# Patient Record
Sex: Female | Born: 1975 | Race: White | Hispanic: No | Marital: Single | Smoking: Current every day smoker
Health system: Southern US, Community
[De-identification: ages and names within clinical notes are randomized; demographics above are authoritative.]

## PROBLEM LIST (undated history)

## (undated) DIAGNOSIS — G4730 Sleep apnea, unspecified: Secondary | ICD-10-CM

## (undated) DIAGNOSIS — E039 Hypothyroidism, unspecified: Secondary | ICD-10-CM

## (undated) DIAGNOSIS — F329 Major depressive disorder, single episode, unspecified: Secondary | ICD-10-CM

## (undated) DIAGNOSIS — F419 Anxiety disorder, unspecified: Secondary | ICD-10-CM

## (undated) DIAGNOSIS — K219 Gastro-esophageal reflux disease without esophagitis: Secondary | ICD-10-CM

## (undated) DIAGNOSIS — K2970 Gastritis, unspecified, without bleeding: Secondary | ICD-10-CM

## (undated) DIAGNOSIS — D649 Anemia, unspecified: Secondary | ICD-10-CM

## (undated) DIAGNOSIS — T8859XA Other complications of anesthesia, initial encounter: Secondary | ICD-10-CM

## (undated) DIAGNOSIS — F32A Depression, unspecified: Secondary | ICD-10-CM

## (undated) DIAGNOSIS — R000 Tachycardia, unspecified: Secondary | ICD-10-CM

## (undated) DIAGNOSIS — M1990 Unspecified osteoarthritis, unspecified site: Secondary | ICD-10-CM

## (undated) DIAGNOSIS — R569 Unspecified convulsions: Secondary | ICD-10-CM

## (undated) DIAGNOSIS — T4145XA Adverse effect of unspecified anesthetic, initial encounter: Secondary | ICD-10-CM

## (undated) DIAGNOSIS — E785 Hyperlipidemia, unspecified: Secondary | ICD-10-CM

## (undated) DIAGNOSIS — G4733 Obstructive sleep apnea (adult) (pediatric): Secondary | ICD-10-CM

## (undated) HISTORY — DX: Unspecified osteoarthritis, unspecified site: M19.90

## (undated) HISTORY — DX: Gastritis, unspecified, without bleeding: K29.70

## (undated) HISTORY — PX: CHOLECYSTECTOMY: SHX55

## (undated) HISTORY — DX: Depression, unspecified: F32.A

## (undated) HISTORY — DX: Hyperlipidemia, unspecified: E78.5

## (undated) HISTORY — PX: ABDOMINAL HYSTERECTOMY: SHX81

## (undated) HISTORY — PX: COLONOSCOPY WITH ESOPHAGOGASTRODUODENOSCOPY (EGD): SHX5779

## (undated) HISTORY — DX: Obstructive sleep apnea (adult) (pediatric): G47.33

## (undated) HISTORY — PX: THYROIDECTOMY: SHX17

---
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
## 2017-02-19 DIAGNOSIS — H538 Other visual disturbances: Secondary | ICD-10-CM | POA: Insufficient documentation

## 2017-02-19 DIAGNOSIS — R202 Paresthesia of skin: Secondary | ICD-10-CM | POA: Insufficient documentation

## 2017-02-19 DIAGNOSIS — R519 Headache, unspecified: Secondary | ICD-10-CM | POA: Insufficient documentation

## 2017-02-19 DIAGNOSIS — G479 Sleep disorder, unspecified: Secondary | ICD-10-CM | POA: Insufficient documentation

## 2017-03-01 DIAGNOSIS — D500 Iron deficiency anemia secondary to blood loss (chronic): Secondary | ICD-10-CM | POA: Insufficient documentation

## 2017-03-31 ENCOUNTER — Ambulatory Visit: Payer: BLUE CROSS/BLUE SHIELD | Attending: Neurology

## 2017-03-31 DIAGNOSIS — G4733 Obstructive sleep apnea (adult) (pediatric): Secondary | ICD-10-CM | POA: Diagnosis present

## 2017-03-31 DIAGNOSIS — Z6841 Body Mass Index (BMI) 40.0 and over, adult: Secondary | ICD-10-CM | POA: Insufficient documentation

## 2017-03-31 DIAGNOSIS — G4710 Hypersomnia, unspecified: Secondary | ICD-10-CM | POA: Insufficient documentation

## 2017-04-08 DIAGNOSIS — R202 Paresthesia of skin: Secondary | ICD-10-CM | POA: Insufficient documentation

## 2017-04-29 ENCOUNTER — Ambulatory Visit: Payer: BLUE CROSS/BLUE SHIELD | Attending: Neurology

## 2017-04-29 DIAGNOSIS — G4733 Obstructive sleep apnea (adult) (pediatric): Secondary | ICD-10-CM | POA: Insufficient documentation

## 2017-05-13 ENCOUNTER — Encounter: Payer: Self-pay | Admitting: *Deleted

## 2017-05-13 ENCOUNTER — Other Ambulatory Visit: Payer: Self-pay

## 2017-05-13 ENCOUNTER — Encounter
Admission: RE | Admit: 2017-05-13 | Discharge: 2017-05-13 | Disposition: A | Discharge status: Home / Self Care | Payer: BLUE CROSS/BLUE SHIELD | Source: Ambulatory Visit | Attending: Obstetrics & Gynecology | Admitting: Obstetrics & Gynecology

## 2017-05-13 HISTORY — DX: Sleep apnea, unspecified: G47.30

## 2017-05-13 HISTORY — DX: Other complications of anesthesia, initial encounter: T88.59XA

## 2017-05-13 HISTORY — DX: Gastro-esophageal reflux disease without esophagitis: K21.9

## 2017-05-13 HISTORY — DX: Hypothyroidism, unspecified: E03.9

## 2017-05-13 HISTORY — DX: Tachycardia, unspecified: R00.0

## 2017-05-13 HISTORY — DX: Anxiety disorder, unspecified: F41.9

## 2017-05-13 HISTORY — DX: Adverse effect of unspecified anesthetic, initial encounter: T41.45XA

## 2017-05-13 HISTORY — DX: Unspecified convulsions: R56.9

## 2017-05-13 HISTORY — DX: Anemia, unspecified: D64.9

## 2017-05-13 NOTE — Patient Instructions (Addendum)
Your procedure is scheduled on: 05-21-17 FRIDAY Report to Same Day Surgery 2nd floor medical mall [facility](Medical Mall Entrance-take elevator on left to 2nd floor.  Check in with surgery information desk.) To find out your arrival time please call [phone] between 1PM - 3PM on 05-20-17 THURSDAY  Remember: Instructions that are not followed completely may result in serious medical risk, up to and including death, or upon the discretion of your surgeon and anesthesiologist your surgery may need to be rescheduled.    _x___ 1. Do not eat food after midnight the night before your procedure. NO GUM OR CANDY AFTER MIDNIGHT.  You may drink clear liquids up to 2 hours before you are scheduled to arrive at the hospital for your procedure.  Do not drink clear liquids within 2 hours of your scheduled arrival to the hospital.  Clear liquids include  --Water or Apple juice without pulp  --Clear carbohydrate beverage such as ClearFast or Gatorade  --Black Coffee or Clear Tea (No milk, no creamers, do not add anything to  the coffee or Tea     __x__ 2. No Alcohol for 24 hours before or after surgery.   __x__3. No Smoking for 24 prior to surgery.   ____  4. Bring all medications with you on the day of surgery if instructed.    __x__ 5. Notify your doctor if there is any change in your medical condition     (cold, fever, infections).     Do not wear jewelry, make-up, hairpins, clips or nail polish.  Do not wear lotions, powders, or perfumes. You may wear deodorant.  Do not shave 48 hours prior to surgery. Men may shave face and neck.  Do not bring valuables to the hospital.    [facility] is not responsible for any belongings or valuables.               Contacts, dentures or bridgework may not be worn into surgery.  Leave your suitcase in the car. After surgery it may be brought to your room.  For patients admitted to the hospital, discharge time is determined by your treatment team.   Patients  discharged the day of surgery will not be allowed to drive home.  You will need someone to drive you home and stay with you the night of your procedure.    Please read over the following fact sheets that you were given:   [facility] Preparing for Surgery and or MRSA Information   _x___ TAKE THE FOLLOWING MEDICATION THE MORNING OF SURGERY WITH A SMALL SIP OF WATER.  These include:  1.  KLONOPIN  2. LEVOHTYROXINE  3. OXYCODONE  4. GABAPENTIN-TAKE 600 MG INSTEAD OF 300 MG AM OF SURGERY PER DR WARD  5.  6.  ____Fleets enema or Magnesium Citrate as directed.   _x___ Use CHG Soap or sage wipes as directed on instruction sheet   ____ Use inhalers on the day of surgery and bring to hospital day of surgery  ____ Stop Metformin and Janumet 2 days prior to surgery.    ____ Take 1/2 of usual insulin dose the night before surgery and none on the morning surgery.   ____ Follow recommendations from Cardiologist, Pulmonologist or PCP regarding stopping Aspirin, Coumadin, Plavix ,Eliquis, Effient, or Pradaxa, and Pletal.  X____Stop Anti-inflammatories such as Advil, Aleve, IBUPROFEN, Motrin, Naproxen, Naprosyn, Goodies powders or aspirin products NOW-OK to take Tylenol    ____ Stop supplements until after surgery.    ____ [employer] Hotels[employer]  C-Pap to the hospital.

## 2017-05-17 ENCOUNTER — Encounter
Admission: RE | Admit: 2017-05-17 | Discharge: 2017-05-17 | Disposition: A | Discharge status: Home / Self Care | Payer: BLUE CROSS/BLUE SHIELD | Source: Ambulatory Visit | Attending: Obstetrics & Gynecology | Admitting: Obstetrics & Gynecology

## 2017-05-17 DIAGNOSIS — Z01810 Encounter for preprocedural cardiovascular examination: Secondary | ICD-10-CM | POA: Diagnosis not present

## 2017-05-17 DIAGNOSIS — R000 Tachycardia, unspecified: Secondary | ICD-10-CM | POA: Diagnosis not present

## 2017-05-17 DIAGNOSIS — Z01812 Encounter for preprocedural laboratory examination: Secondary | ICD-10-CM | POA: Insufficient documentation

## 2017-05-17 LAB — TYPE AND SCREEN
ABO/RH(D): O POS
ANTIBODY SCREEN: NEGATIVE

## 2017-05-20 ENCOUNTER — Encounter: Payer: Self-pay | Admitting: *Deleted

## 2017-05-20 MED ORDER — CEFAZOLIN SODIUM-DEXTROSE 2-4 GM/100ML-% IV SOLN
2.0000 g | Freq: Once | INTRAVENOUS | Status: AC
  Administered 2017-05-21: 2 g via INTRAVENOUS
  Filled 2017-05-20: qty 100

## 2017-05-21 ENCOUNTER — Ambulatory Visit: Payer: BLUE CROSS/BLUE SHIELD | Admitting: Anesthesiology

## 2017-05-21 ENCOUNTER — Encounter: Payer: Self-pay | Admitting: *Deleted

## 2017-05-21 ENCOUNTER — Other Ambulatory Visit: Payer: Self-pay

## 2017-05-21 ENCOUNTER — Encounter
Admission: RE | Disposition: A | Discharge status: Home / Self Care | Payer: Self-pay | Source: Ambulatory Visit | Attending: Obstetrics & Gynecology

## 2017-05-21 ENCOUNTER — Ambulatory Visit
Admission: RE | Admit: 2017-05-21 | Discharge: 2017-05-21 | Disposition: A | Discharge status: Home / Self Care | Payer: BLUE CROSS/BLUE SHIELD | Source: Ambulatory Visit | Attending: Obstetrics & Gynecology | Admitting: Obstetrics & Gynecology

## 2017-05-21 DIAGNOSIS — G4730 Sleep apnea, unspecified: Secondary | ICD-10-CM | POA: Insufficient documentation

## 2017-05-21 DIAGNOSIS — N3289 Other specified disorders of bladder: Secondary | ICD-10-CM | POA: Insufficient documentation

## 2017-05-21 DIAGNOSIS — G8929 Other chronic pain: Secondary | ICD-10-CM | POA: Insufficient documentation

## 2017-05-21 DIAGNOSIS — N736 Female pelvic peritoneal adhesions (postinfective): Secondary | ICD-10-CM | POA: Diagnosis not present

## 2017-05-21 DIAGNOSIS — Z888 Allergy status to other drugs, medicaments and biological substances status: Secondary | ICD-10-CM | POA: Diagnosis not present

## 2017-05-21 DIAGNOSIS — Z79899 Other long term (current) drug therapy: Secondary | ICD-10-CM | POA: Insufficient documentation

## 2017-05-21 DIAGNOSIS — N3080 Other cystitis without hematuria: Secondary | ICD-10-CM | POA: Insufficient documentation

## 2017-05-21 DIAGNOSIS — R102 Pelvic and perineal pain: Secondary | ICD-10-CM | POA: Diagnosis present

## 2017-05-21 DIAGNOSIS — E039 Hypothyroidism, unspecified: Secondary | ICD-10-CM | POA: Insufficient documentation

## 2017-05-21 DIAGNOSIS — F17210 Nicotine dependence, cigarettes, uncomplicated: Secondary | ICD-10-CM | POA: Diagnosis not present

## 2017-05-21 DIAGNOSIS — K5730 Diverticulosis of large intestine without perforation or abscess without bleeding: Secondary | ICD-10-CM | POA: Insufficient documentation

## 2017-05-21 DIAGNOSIS — F419 Anxiety disorder, unspecified: Secondary | ICD-10-CM | POA: Insufficient documentation

## 2017-05-21 DIAGNOSIS — N83292 Other ovarian cyst, left side: Secondary | ICD-10-CM | POA: Insufficient documentation

## 2017-05-21 HISTORY — PX: LAPAROSCOPY: SHX197

## 2017-05-21 LAB — ABO/RH: ABO/RH(D): O POS

## 2017-05-21 SURGERY — LAPAROSCOPY OPERATIVE
Anesthesia: General

## 2017-05-21 MED ORDER — MIDAZOLAM HCL 2 MG/2ML IJ SOLN
INTRAMUSCULAR | Status: AC
  Filled 2017-05-21: qty 2

## 2017-05-21 MED ORDER — FENTANYL CITRATE (PF) 100 MCG/2ML IJ SOLN
INTRAMUSCULAR | Status: AC
  Filled 2017-05-21: qty 2

## 2017-05-21 MED ORDER — FENTANYL CITRATE (PF) 100 MCG/2ML IJ SOLN
INTRAMUSCULAR | Status: DC | PRN
  Administered 2017-05-21 (×3): 50 ug via INTRAVENOUS

## 2017-05-21 MED ORDER — ROCURONIUM BROMIDE 50 MG/5ML IV SOLN
INTRAVENOUS | Status: AC
  Filled 2017-05-21: qty 1

## 2017-05-21 MED ORDER — PROMETHAZINE HCL 25 MG/ML IJ SOLN: 6.2500 mg | INTRAMUSCULAR | Status: DC | PRN

## 2017-05-21 MED ORDER — LACTATED RINGERS IV SOLN
INTRAVENOUS | Status: DC
  Administered 2017-05-21 (×2): via INTRAVENOUS

## 2017-05-21 MED ORDER — SEVOFLURANE IN SOLN
RESPIRATORY_TRACT | Status: AC
  Filled 2017-05-21: qty 250

## 2017-05-21 MED ORDER — LACTATED RINGERS IV SOLN: INTRAVENOUS | Status: DC

## 2017-05-21 MED ORDER — MIDAZOLAM HCL 2 MG/2ML IJ SOLN
INTRAMUSCULAR | Status: DC | PRN
  Administered 2017-05-21: 2 mg via INTRAVENOUS

## 2017-05-21 MED ORDER — CELECOXIB 200 MG PO CAPS
400.0000 mg | ORAL_CAPSULE | ORAL | Status: AC
  Administered 2017-05-21: 400 mg via ORAL

## 2017-05-21 MED ORDER — DEXAMETHASONE SODIUM PHOSPHATE 10 MG/ML IJ SOLN
INTRAMUSCULAR | Status: DC | PRN
  Administered 2017-05-21: 10 mg via INTRAVENOUS

## 2017-05-21 MED ORDER — METHYLENE BLUE 1 % INJ SOLN
INTRAMUSCULAR | Status: AC
  Filled 2017-05-21: qty 10

## 2017-05-21 MED ORDER — CEFAZOLIN SODIUM-DEXTROSE 2-4 GM/100ML-% IV SOLN
INTRAVENOUS | Status: AC
  Filled 2017-05-21: qty 100

## 2017-05-21 MED ORDER — ACETAMINOPHEN 325 MG PO TABS: 650.0000 mg | ORAL_TABLET | ORAL | Status: DC | PRN

## 2017-05-21 MED ORDER — FAMOTIDINE 20 MG PO TABS
ORAL_TABLET | ORAL | Status: AC
  Administered 2017-05-21: 20 mg via ORAL
  Filled 2017-05-21: qty 1

## 2017-05-21 MED ORDER — KETOROLAC TROMETHAMINE 30 MG/ML IJ SOLN
INTRAMUSCULAR | Status: AC
  Filled 2017-05-21: qty 1

## 2017-05-21 MED ORDER — SUGAMMADEX SODIUM 500 MG/5ML IV SOLN
INTRAVENOUS | Status: DC | PRN
  Administered 2017-05-21: 270 mg via INTRAVENOUS

## 2017-05-21 MED ORDER — OXYCODONE HCL 5 MG PO TABS
ORAL_TABLET | ORAL | Status: AC
  Filled 2017-05-21: qty 1

## 2017-05-21 MED ORDER — LIDOCAINE HCL (PF) 2 % IJ SOLN
INTRAMUSCULAR | Status: AC
  Filled 2017-05-21: qty 10

## 2017-05-21 MED ORDER — OXYCODONE HCL 5 MG PO TABS
5.0000 mg | ORAL_TABLET | ORAL | Status: DC | PRN
  Administered 2017-05-21: 5 mg via ORAL
  Filled 2017-05-21: qty 1

## 2017-05-21 MED ORDER — KETOROLAC TROMETHAMINE 30 MG/ML IJ SOLN
30.0000 mg | Freq: Four times a day (QID) | INTRAMUSCULAR | Status: DC
  Administered 2017-05-21: 30 mg via INTRAVENOUS
  Filled 2017-05-21 (×4): qty 1

## 2017-05-21 MED ORDER — PROPOFOL 10 MG/ML IV BOLUS
INTRAVENOUS | Status: AC
  Filled 2017-05-21: qty 40

## 2017-05-21 MED ORDER — LIDOCAINE HCL (CARDIAC) 20 MG/ML IV SOLN
INTRAVENOUS | Status: DC | PRN
  Administered 2017-05-21: 100 mg via INTRAVENOUS

## 2017-05-21 MED ORDER — IBUPROFEN 800 MG PO TABS: 800.0000 mg | ORAL_TABLET | Freq: Four times a day (QID) | ORAL | 1 refills | Status: DC

## 2017-05-21 MED ORDER — ROCURONIUM BROMIDE 100 MG/10ML IV SOLN
INTRAVENOUS | Status: DC | PRN
  Administered 2017-05-21: 50 mg via INTRAVENOUS
  Administered 2017-05-21: 20 mg via INTRAVENOUS

## 2017-05-21 MED ORDER — ACETAMINOPHEN 500 MG PO TABS
ORAL_TABLET | ORAL | Status: AC
  Administered 2017-05-21: 1000 mg via ORAL
  Filled 2017-05-21: qty 2

## 2017-05-21 MED ORDER — FENTANYL CITRATE (PF) 100 MCG/2ML IJ SOLN
25.0000 ug | INTRAMUSCULAR | Status: DC | PRN
  Administered 2017-05-21: 25 ug via INTRAVENOUS
  Administered 2017-05-21: 50 ug via INTRAVENOUS
  Administered 2017-05-21: 25 ug via INTRAVENOUS

## 2017-05-21 MED ORDER — PHENYLEPHRINE HCL 10 MG/ML IJ SOLN
INTRAMUSCULAR | Status: AC
  Filled 2017-05-21: qty 1

## 2017-05-21 MED ORDER — FAMOTIDINE 20 MG PO TABS
20.0000 mg | ORAL_TABLET | Freq: Once | ORAL | Status: AC
  Administered 2017-05-21: 20 mg via ORAL

## 2017-05-21 MED ORDER — DEXAMETHASONE SODIUM PHOSPHATE 10 MG/ML IJ SOLN
INTRAMUSCULAR | Status: AC
  Administered 2017-05-21: 4 mg via INTRAVENOUS
  Filled 2017-05-21: qty 1

## 2017-05-21 MED ORDER — OXYCODONE HCL 5 MG/5ML PO SOLN: 5.0000 mg | Freq: Once | ORAL | Status: DC | PRN

## 2017-05-21 MED ORDER — ACETAMINOPHEN 650 MG RE SUPP
650.0000 mg | RECTAL | Status: DC | PRN
  Filled 2017-05-21: qty 1

## 2017-05-21 MED ORDER — HEPARIN SODIUM (PORCINE) 5000 UNIT/ML IJ SOLN
INTRAMUSCULAR | Status: AC
  Administered 2017-05-21: 5000 [IU] via SUBCUTANEOUS
  Filled 2017-05-21: qty 1

## 2017-05-21 MED ORDER — OXYCODONE HCL 5 MG PO TABS: 5.0000 mg | ORAL_TABLET | Freq: Once | ORAL | Status: DC | PRN

## 2017-05-21 MED ORDER — FENTANYL CITRATE (PF) 100 MCG/2ML IJ SOLN
INTRAMUSCULAR | Status: AC
  Administered 2017-05-21: 25 ug via INTRAVENOUS
  Filled 2017-05-21: qty 2

## 2017-05-21 MED ORDER — SUGAMMADEX SODIUM 500 MG/5ML IV SOLN
INTRAVENOUS | Status: AC
  Filled 2017-05-21: qty 5

## 2017-05-21 MED ORDER — OXYCODONE HCL 5 MG PO TABS: 5.0000 mg | ORAL_TABLET | ORAL | 0 refills | Status: DC | PRN

## 2017-05-21 MED ORDER — PROPOFOL 10 MG/ML IV BOLUS
INTRAVENOUS | Status: DC | PRN
  Administered 2017-05-21: 200 mg via INTRAVENOUS
  Administered 2017-05-21: 100 mg via INTRAVENOUS

## 2017-05-21 MED ORDER — DEXAMETHASONE SODIUM PHOSPHATE 10 MG/ML IJ SOLN
4.0000 mg | INTRAMUSCULAR | Status: AC
  Administered 2017-05-21: 4 mg via INTRAVENOUS

## 2017-05-21 MED ORDER — ONDANSETRON HCL 4 MG/2ML IJ SOLN
INTRAMUSCULAR | Status: DC | PRN
  Administered 2017-05-21: 4 mg via INTRAVENOUS

## 2017-05-21 MED ORDER — CELECOXIB 200 MG PO CAPS
ORAL_CAPSULE | ORAL | Status: AC
  Administered 2017-05-21: 400 mg via ORAL
  Filled 2017-05-21: qty 2

## 2017-05-21 MED ORDER — ACETAMINOPHEN 500 MG PO TABS
1000.0000 mg | ORAL_TABLET | Freq: Once | ORAL | Status: AC
  Administered 2017-05-21: 1000 mg via ORAL

## 2017-05-21 MED ORDER — MEPERIDINE HCL 50 MG/ML IJ SOLN: 6.2500 mg | INTRAMUSCULAR | Status: DC | PRN

## 2017-05-21 MED ORDER — HEPARIN SODIUM (PORCINE) 5000 UNIT/ML IJ SOLN
5000.0000 [IU] | INTRAMUSCULAR | Status: AC
  Administered 2017-05-21: 5000 [IU] via SUBCUTANEOUS

## 2017-05-21 MED ORDER — MORPHINE SULFATE (PF) 4 MG/ML IV SOLN: 1.0000 mg | INTRAVENOUS | Status: DC | PRN

## 2017-05-21 SURGICAL SUPPLY — 51 items
APPLICATOR ARISTA FLEXITIP XL (MISCELLANEOUS) ×3 IMPLANT
APPLIER CLIP LOGIC TI 5 (MISCELLANEOUS) ×3 IMPLANT
BAG URINE DRAINAGE (UROLOGICAL SUPPLIES) ×3 IMPLANT
BARRIER ADH SEPRAFILM 3INX5IN (MISCELLANEOUS) ×6 IMPLANT
BLADE SURG SZ11 CARB STEEL (BLADE) ×3 IMPLANT
CANISTER SUCT 1200ML W/VALVE (MISCELLANEOUS) ×3 IMPLANT
CATH FOLEY 2WAY  5CC 16FR (CATHETERS) ×4
CATH URTH 16FR FL 2W BLN LF (CATHETERS) ×2 IMPLANT
CHLORAPREP W/TINT 26ML (MISCELLANEOUS) ×6 IMPLANT
COVER LIGHT HANDLE STERIS (MISCELLANEOUS) IMPLANT
DERMABOND ADVANCED (GAUZE/BANDAGES/DRESSINGS)
DERMABOND ADVANCED .7 DNX12 (GAUZE/BANDAGES/DRESSINGS) IMPLANT
DRAPE LEGGINS SURG 28X43 STRL (DRAPES) ×3 IMPLANT
DRAPE SHEET LG 3/4 BI-LAMINATE (DRAPES) ×3 IMPLANT
DRAPE UNDER BUTTOCK W/FLU (DRAPES) ×3 IMPLANT
DRSG TELFA 4X3 1S NADH ST (GAUZE/BANDAGES/DRESSINGS) ×3 IMPLANT
GLOVE BIO SURGEON STRL SZ7 (GLOVE) ×3 IMPLANT
GLOVE INDICATOR 7.5 STRL GRN (GLOVE) ×3 IMPLANT
GLOVE PI ORTHOPRO 6.5 (GLOVE) ×2
GLOVE PI ORTHOPRO STRL 6.5 (GLOVE) ×1 IMPLANT
GLOVE SURG SYN 6.5 ES PF (GLOVE) ×6 IMPLANT
GOWN STRL REUS W/ TWL LRG LVL3 (GOWN DISPOSABLE) ×2 IMPLANT
GOWN STRL REUS W/ TWL XL LVL3 (GOWN DISPOSABLE) ×1 IMPLANT
GOWN STRL REUS W/TWL LRG LVL3 (GOWN DISPOSABLE) ×4
GOWN STRL REUS W/TWL XL LVL3 (GOWN DISPOSABLE) ×2
GRASPER SUT TROCAR 14GX15 (MISCELLANEOUS) ×3 IMPLANT
HEMOSTAT ARISTA ABSORB 3G PWDR (MISCELLANEOUS) ×3 IMPLANT
IRRIGATION STRYKERFLOW (MISCELLANEOUS) ×1 IMPLANT
IRRIGATOR STRYKERFLOW (MISCELLANEOUS) ×3
IV LACTATED RINGERS 1000ML (IV SOLUTION) ×3 IMPLANT
KIT PINK PAD W/HEAD ARE REST (MISCELLANEOUS) ×3
KIT PINK PAD W/HEAD ARM REST (MISCELLANEOUS) ×1 IMPLANT
KIT TURNOVER CYSTO (KITS) ×3 IMPLANT
L-HOOK LAP DISP 36CM (ELECTROSURGICAL) ×3
LABEL OR SOLS (LABEL) IMPLANT
LHOOK LAP DISP 36CM (ELECTROSURGICAL) ×1 IMPLANT
LIGASURE VESSEL 5MM BLUNT TIP (ELECTROSURGICAL) IMPLANT
NS IRRIG 500ML POUR BTL (IV SOLUTION) ×3 IMPLANT
PACK LAP CHOLECYSTECTOMY (MISCELLANEOUS) ×3 IMPLANT
PAD OB MATERNITY 4.3X12.25 (PERSONAL CARE ITEMS) ×3 IMPLANT
PAD PREP 24X41 OB/GYN DISP (PERSONAL CARE ITEMS) ×3 IMPLANT
PENCIL ELECTRO HAND CTR (MISCELLANEOUS) ×3 IMPLANT
SLEEVE ENDOPATH XCEL 5M (ENDOMECHANICALS) ×6 IMPLANT
SUT MNCRL 4-0 (SUTURE) ×2
SUT MNCRL 4-0 27XMFL (SUTURE) ×1
SUT MNCRL AB 4-0 PS2 18 (SUTURE) ×3 IMPLANT
SUT VIC AB 2-0 SH 27 (SUTURE) ×2
SUT VIC AB 2-0 SH 27XBRD (SUTURE) ×1 IMPLANT
SUTURE MNCRL 4-0 27XMF (SUTURE) ×1 IMPLANT
TROCAR XCEL NON-BLD 5MMX100MML (ENDOMECHANICALS) ×3 IMPLANT
TUBING INSUFFLATION (TUBING) ×3 IMPLANT

## 2017-05-21 NOTE — Anesthesia Post-op Follow-up Note (Signed)
Anesthesia QCDR form completed.        

## 2017-05-21 NOTE — H&P (Signed)
Preoperative History and Physical  Lorin Picketara Fulmore is a 42 y.o. with continued chronic pelvic pain after hysterectomy. Neuromodulating medications have not been satisfactorily helpful and side effects have been not tolerable. No significant preoperative concerns.  Proposed surgery: exploratory laparoscopy and lysis of adhesions  Past Medical History:  Diagnosis Date  . Anemia    IRON INFUSION IN DEC 2018  . Anxiety   . Complication of anesthesia    AFTER HYSTERECTOMY, HAD TROUBLE BREATHING  . GERD (gastroesophageal reflux disease)    RARE-NO MEDS  . Hypothyroidism   . Seizures (HCC)    AS A CHILD X 5-6 TIMES  . Sleep apnea     RECENTLY DX AND NO CPAP  . Tachycardia    PTS PCP JUST TOOK PT OFF OF HER NORTRYPTILINE ON 05-11-17 THINKING THIS MAY BE CAUSING TACHYCARDIA   Past Surgical History:  Procedure Laterality Date  . ABDOMINAL HYSTERECTOMY    . CHOLECYSTECTOMY    . COLONOSCOPY WITH ESOPHAGOGASTRODUODENOSCOPY (EGD)    . THYROIDECTOMY     AGE 34   OB History  No data available  Patient denies any other pertinent gynecologic issues.   No current facility-administered medications on file prior to encounter.    Current Outpatient Medications on File Prior to Encounter  Medication Sig Dispense Refill  . clonazePAM (KLONOPIN) 1 MG tablet Take 1 mg by mouth every morning. For anxiety.  1  . gabapentin (NEURONTIN) 300 MG capsule Take 300-600 mg by mouth 3 (three) times daily. Take 1 capsule (300 mg) by mouth in the morning, 1 capsule (300 mg) by mouth in the afternoon, & 2 capsules (600 mg) by mouth at bedtime.  2  . hyoscyamine (LEVSIN SL) 0.125 MG SL tablet Take 0.125 mg by mouth every 6 (six) hours as needed for cramping.  1  . ibuprofen (ADVIL,MOTRIN) 800 MG tablet Take 800 mg by mouth every 8 (eight) hours as needed (for pain.).    Marland Kitchen. levothyroxine (SYNTHROID, LEVOTHROID) 125 MCG tablet Take 125 mcg by mouth daily before breakfast.  2  . oxyCODONE (OXY IR/ROXICODONE) 5 MG  immediate release tablet Take 5 mg by mouth 4 (four) times daily. For pain.  0  . nortriptyline (PAMELOR) 10 MG capsule Take 20-30 mg by mouth at bedtime.  2   Allergies  Allergen Reactions  . Gabapentin Rash    RASH TO FACE BUT STILL TAKES DAILY    Social History:   reports that she has been smoking cigarettes.  She has a 25.00 pack-year smoking history. she has never used smokeless tobacco. She reports that she does not drink alcohol or use drugs.  History reviewed. No pertinent family history.  Review of Systems: Noncontributory  PHYSICAL EXAM: Blood pressure 130/71, pulse 87, temperature (!) 97.1 F (36.2 C), temperature source Tympanic, resp. rate 18, height 5\' 4"  (1.626 m), weight 132 kg (291 lb), SpO2 99 %. General appearance - alert, well appearing, and in no distress Chest - clear to auscultation, no wheezes, rales or rhonchi, symmetric air entry Heart - normal rate and regular rhythm Abdomen - soft, nontender, nondistended, no masses or organomegaly Pelvic - examination not indicated Extremities - peripheral pulses normal, no pedal edema, no clubbing or cyanosis  Labs: Results for orders placed or performed during the hospital encounter of 05/21/17 (from the past 336 hour(s))  ABO/Rh   Collection Time: 05/21/17  6:23 AM  Result Value Ref Range   ABO/RH(D)      O POS Performed at [facility]  Lab, [address] [place], [place] [address]   Results for orders placed or performed during the hospital encounter of 05/17/17 (from the past 336 hour(s))  Type and screen [facility]   Collection Time: 05/17/17  8:16 AM  Result Value Ref Range   ABO/RH(D) O POS    Antibody Screen NEG    Sample Expiration 05/31/2017    Extend sample reason      NO TRANSFUSIONS OR PREGNANCY IN THE PAST 3 MONTHS Performed at [facility], [address] [place], [place] [address]     Imaging Studies: No results found.  Assessment: Chronic pelvic  pain  Plan: Patient will undergo surgical management with exploratory laparoscopy, lysis of adhesions, and other procedures as indicated.   The risks of surgery were discussed in detail with the patient including but not limited to: bleeding which may require transfusion or reoperation; infection which may require antibiotics; injury to surrounding organs which may involve bowel, bladder, ureters ; need for additional procedures including laparoscopy or laparotomy; thromboembolic phenomenon, surgical site problems and other postoperative/anesthesia complications. Likelihood of success in alleviating the patient's condition was discussed. Routine postoperative instructions will be reviewed with the patient and her family in detail after surgery.  The patient concurred with the proposed plan, giving informed written consent for the surgery.  Patient has been NPO since last night she will remain NPO for procedure.  Anesthesia and OR aware.    ----- Ranae Plumber, MD Attending Obstetrician and Gynecologist [facility], Department of OB/GYN [facility]

## 2017-05-21 NOTE — Discharge Instructions (Addendum)
Discharge instructions:  Call office if you have any of the following: fever >101 F, chills, incision drainage or problems, leg pain or redness, or any other concerns.   Activity: Do not lift > 10 lbs for 8 weeks.  No driving for 1-2 weeks.   You may feel some pain in your upper right abdomen/rib and right shoulder.  This is from the gas in the abdomen for surgery. This will subside over time, please be patient!  Take 600mg  Ibuprofen and 1000mg  Tylenol around the clock, every 6 hours for at least the first 3-5 days.  After this you can take as needed.  This will help decrease inflammation and promote healing.  The narcotics you'll take just as needed, as they just trick your brain into thinking its not in pain.    Please don't limit yourself in terms of routine activity.  You will be able to do most things, although they may take longer to do or be a little painful.  You can do it!  Don't be a hero, but don't be a wimp either!      AMBULATORY SURGERY  DISCHARGE INSTRUCTIONS   1) The drugs that you were given will stay in your system until tomorrow so for the next 24 hours you should not:  A) Drive an automobile B) Make any legal decisions C) Drink any alcoholic beverage   2) You may resume regular meals tomorrow.  Today it is better to start with liquids and gradually work up to solid foods.  You may eat anything you prefer, but it is better to start with liquids, then soup and crackers, and gradually work up to solid foods.   3) Please notify your doctor immediately if you have any unusual bleeding, trouble breathing, redness and pain at the surgery site, drainage, fever, or pain not relieved by medication.    4) Additional Instructions:        Please contact your physician with any problems or Same Day Surgery at [phone], Monday through Friday 6 am to 4 pm, or [place] at [facility] Main number at [phone].

## 2017-05-21 NOTE — Anesthesia Postprocedure Evaluation (Signed)
Anesthesia Post Note  Patient: Dana Shaffer January  Procedure(s) Performed: Exploratory laporscropsy, lyses of adhesions, left cyst wall removal (N/A )  Patient location during evaluation: PACU Anesthesia Type: General Level of consciousness: awake and alert and oriented Pain management: pain level controlled Vital Signs Assessment: post-procedure vital signs reviewed and stable Respiratory status: spontaneous breathing, nonlabored ventilation and respiratory function stable Cardiovascular status: blood pressure returned to baseline and stable Postop Assessment: no signs of nausea or vomiting Anesthetic complications: no     Last Vitals:  Vitals:   05/21/17 1058 05/21/17 1203  BP: (!) 141/81 139/67  Pulse: 87 82  Resp: 18 18  Temp: (!) 36.4 C   SpO2: 99% 98%    Last Pain:  Vitals:   05/21/17 1203  TempSrc:   PainSc: 3                  [name]

## 2017-05-21 NOTE — Op Note (Addendum)
Dana Shaffer PROCEDURE DATE: 05/21/2017  PATIENT:  Dana Shaffer  42 y.o. female  PRE-OPERATIVE DIAGNOSIS:  Pelvic Pain, stage 4 endometriosis, history of postop intra-abdominal infection  POST-OPERATIVE DIAGNOSIS:  Same, mucinous ovarian cyst, pelvic adhesions  PROCEDURE:  Procedure(s) with comments: Exploratory laparoscopy, extensive lysis of adhesions, left cyst wall removal (N/A) - Lysis of Adhesions Peritoneal stripping enterolysis  SURGEON:  Surgeon(s) and Role:    * Dory Demont, Elenora Fender, MD - Primary Assist - Christeen Douglas, MD  ANESTHESIA:  General via ET  I/O  Total I/O In: 800 [I.V.:800] Out: 320 [Urine:300; Blood:20]  FINDINGS:   -Normal upper abdomen. -inflamed and hypervascular appearing small bowel -Small Filmy adhesion of ascending colon to right anteriolateral abdominal wall at site of lap chole trochar  -many filmy adhesions coating the entire pelvis, from both sigmoid and small bowel to pelvic peritoneum.   -1.5cm posterior bladder nodule (right) -taught peritoneum across anterior bladder tranversely with clear vesicular nodules on brim. -dense adhesions of sigmoid over and around left ovary and to left lateral pelvic sidewall. -rind of inflammatory tissue surrounding the IP aspect of the left ovary with bowel adhesions -5cm left apical ovarian cyst, with mucinous contents -non-obliterated posterior cul de sac -several sites of diverticulosis of distal sigmoid colon  SPECIMENS:  1. Anterior bladder peritoneum 2. Left ovarian cyst wall and contents 3. Posterior bladder nodule  COMPLICATIONS: none apparent  DISPOSITION: vital signs stable to PACU   Indication for Surgery: 42 y.o. 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  She has tried a Engineer, water of medications for pain control, and has had dissatisfactory side effects from most.  She asks for a scope to see if there are any  adhesions that can be resolved to ameliorate her pain.  Risks of surgery were discussed with the patient including but not limited to: bleeding which may require transfusion or reoperation; infection which may require antibiotics; injury to bowel, bladder, ureters or other surrounding organs; need for additional procedures including laparotomy, blood clot, incisional problems, unsatisfactory pain resolution, and other postoperative/anesthesia complications. Written informed consent was obtained.     PROCEDURE IN DETAIL:  The patient had sequential compression devices applied to her lower extremities while in the preoperative area.  She was then taken to the operating room where general anesthesia was administered and was found to be adequate. IV antibiotics were administered.  She was placed in the dorsal lithotomy position, and was prepped and draped in a sterile manner.  A Foley catheter was inserted into her bladder and attached to constant drainage and a sponge stick inserted into the vagina.  After a surgical timeout was performed, attention was turned to the abdomen where an umbilical incision was made with the scalpel. A 5mm trochar was inserted in the umbilical incision using a visiport method. Opening pressure was , and the abdomen was insufflated to carbon dioxide gas and adequate pneumoperitoneum was obtained.  A survey of the patient's pelvis and abdomen revealed the findings as mentioned above. Two 5mm ports were inserted in the lower left and right quadrants under visualization.    The bowel was adherent to the pelvic peritoneum, retracted, and the bovie L hook was used to ligate the adhesions and draw the small bowel out of the pelvis.  The posterior bladder nodule was uncovered by this.  The bovie was used to isolate a perimeter around the nodule and then it  was grasped, retracted, undermined, ligated and handed off to nursing.    The patient's main complaint of pain is with a  full bladder and bilateral.  The taught peritoneum anterior to the bladder was a plausible source of this pain, thus the decision to strip the peritoneum at this site to reduce the tension.  The lateral aspect of the peritoneum was entered, and the entire area was undermined, stripping away the adipose and alveolar tissues from the peritoneum, and it was excised across the bladder and anterior pelvis from left brim to right brim.    The attention was then turned to the left pelvic side wall, where the left ovary was not visible.  The sigmoid was retracted medially and the adhesions to the side wall were divided.  The anterior ovary was uncovered, and the bowel adhesions to the ovary were divided.  The posterior ovary was freed with blunt dissection, and a rind of inflammatory tissue was peeled off of the cephalad end, which was covering the IP.  The ovarian vessels were breached in a small area while attempting this, and three vascular clips were superficially applied and hemostasis was achieved. The ovary was freed from the bowel and the inflammatory tissue was removed.    The apical ovarian cyst was purposely punctured for drainage, and no fluid was evacuated.  The wall was then divided and opened, and a large blob of mucin was extracted in whole.  The cyst wall was peeled off from the remaining ovarian tissue, and this plus the mucin were handed off to nursing.  Arista was sprinkled into the ovarian stromal bed, for hemostasis.    Seprafilm was made into a gel and sprayed over the left ovary.     The operative site was surveyed, and it was found to be hemostatic.  No intraoperative injury to surrounding organs was noted.  The abdomen was desufflated to 7mmHg and hemostasis was observed. The pneumoperitoneum was deflated.  All instruments were then removed from the patient's abdomen. All skin incisions were closed with 4-0 monocryl and covered with surgical glue. The vaginal sponge stick and foley catheter  were removed. The patient tolerated the procedures well.  All instruments, needles, and sponge counts were correct x 2. The patient was taken to the recovery room in stable condition.   ---- Ranae Plumberhelsea Amahd Morino, MD Attending Obstetrician and Gynecologist [facility]

## 2017-05-21 NOTE — Anesthesia Preprocedure Evaluation (Addendum)
Anesthesia Evaluation  Patient identified by MRN, date of birth, ID band Patient awake    Reviewed: Allergy & Precautions, NPO status , Patient's Chart, lab work & pertinent test results  History of Anesthesia Complications (+) history of anesthetic complications (difficulty breathing after hysterectomy)  Airway Mallampati: I  TM Distance: >3 FB Neck ROM: Full    Dental  (+) Chipped   Pulmonary sleep apnea (recently diagnosed, does not have CPAP) , neg COPD, Current Smoker,    breath sounds clear to auscultation- rhonchi (-) wheezing      Cardiovascular Exercise Tolerance: Good (-) hypertension(-) CAD, (-) Past MI, (-) Cardiac Stents and (-) CABG  Rhythm:Regular Rate:Normal - Systolic murmurs and - Diastolic murmurs    Neuro/Psych Seizures: during childhood only.  Anxiety    GI/Hepatic Neg liver ROS, GERD  ,  Endo/Other  neg diabetesHypothyroidism   Renal/GU negative Renal ROS     Musculoskeletal negative musculoskeletal ROS (+)   Abdominal (+) + obese,   Peds  Hematology  (+) anemia ,   Anesthesia Other Findings Past Medical History: No date: Anemia     Comment:  IRON INFUSION IN DEC 2018 No date: Anxiety No date: Complication of anesthesia     Comment:  AFTER HYSTERECTOMY, HAD TROUBLE BREATHING No date: GERD (gastroesophageal reflux disease)     Comment:  RARE-NO MEDS No date: Hypothyroidism No date: Seizures (HCC)     Comment:  AS A CHILD X 5-6 TIMES No date: Sleep apnea     Comment:   RECENTLY DX AND NO CPAP No date: Tachycardia     Comment:  PTS PCP JUST TOOK PT OFF OF HER NORTRYPTILINE ON 05-11-17              THINKING THIS MAY BE CAUSING TACHYCARDIA   Reproductive/Obstetrics                            Anesthesia Physical Anesthesia Plan  ASA: II  Anesthesia Plan: General   Post-op Pain Management:    Induction: Intravenous  PONV Risk Score and Plan: 1 and  Ondansetron, Dexamethasone and Midazolam  Airway Management Planned: Oral ETT  Additional Equipment:   Intra-op Plan:   Post-operative Plan: Extubation in OR  Informed Consent: I have reviewed the patients History and Physical, chart, labs and discussed the procedure including the risks, benefits and alternatives for the proposed anesthesia with the patient or authorized representative who has indicated his/her understanding and acceptance.   Dental advisory given  Plan Discussed with: CRNA and Anesthesiologist  Anesthesia Plan Comments:         Anesthesia Quick Evaluation

## 2017-05-21 NOTE — Anesthesia Procedure Notes (Signed)
Procedure Name: Intubation Date/Time: 05/21/2017 7:29 AM Performed by: Silvana Newness, CRNA Pre-anesthesia Checklist: Patient identified, Emergency Drugs available, Suction available, Patient being monitored and Timeout performed Patient Re-evaluated:Patient Re-evaluated prior to induction Oxygen Delivery Method: Circle system utilized Preoxygenation: Pre-oxygenation with 100% oxygen Induction Type: IV induction Ventilation: Mask ventilation without difficulty Laryngoscope Size: Mac and 3 Grade View: Grade II Tube type: Oral Tube size: 7.0 mm Number of attempts: 1 Airway Equipment and Method: Stylet Placement Confirmation: ETT inserted through vocal cords under direct vision,  positive ETCO2 and breath sounds checked- equal and bilateral Secured at: 19 cm Tube secured with: Tape Dental Injury: Teeth and Oropharynx as per pre-operative assessment

## 2017-05-21 NOTE — Transfer of Care (Signed)
Immediate Anesthesia Transfer of Care Note  Patient: Dana Shaffer  Procedure(s) Performed: Exploratory laporscropsy, lyses of adhesions, left cyst wall removal (N/A )  Patient Location: PACU  Anesthesia Type:General  Level of Consciousness: drowsy and patient cooperative  Airway & Oxygen Therapy: Patient Spontanous Breathing and Patient connected to face mask oxygen  Post-op Assessment: Report given to RN and Post -op Vital signs reviewed and stable  Post vital signs: Reviewed and stable  Last Vitals:  Vitals:   05/21/17 0617 05/21/17 0956  BP: 130/71   Pulse: 87   Resp: 18   Temp: (!) 36.2 C (P) 36.7 C  SpO2: 99%     Last Pain:  Vitals:   05/21/17 0617  TempSrc: Tympanic  PainSc: 4          Complications: No apparent anesthesia complications

## 2017-05-24 LAB — SURGICAL PATHOLOGY

## 2017-06-01 ENCOUNTER — Other Ambulatory Visit: Payer: Self-pay | Admitting: Obstetrics and Gynecology

## 2017-06-01 ENCOUNTER — Other Ambulatory Visit: Payer: Self-pay | Admitting: Obstetrics & Gynecology

## 2017-06-01 DIAGNOSIS — G8918 Other acute postprocedural pain: Secondary | ICD-10-CM

## 2017-06-04 ENCOUNTER — Ambulatory Visit
Admission: RE | Admit: 2017-06-04 | Discharge: 2017-06-04 | Disposition: A | Discharge status: Home / Self Care | Payer: BLUE CROSS/BLUE SHIELD | Source: Ambulatory Visit | Attending: Obstetrics & Gynecology | Admitting: Obstetrics & Gynecology

## 2017-06-04 DIAGNOSIS — Z9889 Other specified postprocedural states: Secondary | ICD-10-CM | POA: Diagnosis not present

## 2017-06-04 DIAGNOSIS — G8918 Other acute postprocedural pain: Secondary | ICD-10-CM | POA: Diagnosis not present

## 2017-06-04 MED ORDER — IOPAMIDOL (ISOVUE-300) INJECTION 61%
100.0000 mL | Freq: Once | INTRAVENOUS | Status: AC | PRN
Start: 2017-06-04 — End: 2017-06-04
  Administered 2017-06-04: 100 mL via INTRAVENOUS

## 2018-07-14 ENCOUNTER — Telehealth: Payer: BLUE CROSS/BLUE SHIELD | Admitting: Family

## 2018-07-14 DIAGNOSIS — R05 Cough: Secondary | ICD-10-CM

## 2018-07-14 DIAGNOSIS — R059 Cough, unspecified: Secondary | ICD-10-CM

## 2018-07-14 NOTE — Progress Notes (Signed)
  E-Visit for Corona Virus Screening  Based on what you have shared with me, you need to seek an evaluation for a severe illness that is causing your symptoms which may be coronavirus or some other illness. I recommend that you be seen and evaluated "face to face". Our Emergency Departments are best equipped to handle patients with severe symptoms.   I recommend the following:  . If you are having a true medical emergency please call 911. . If you are considered high risk for Corona virus because of a known exposure, fever, shortness of breath and cough, OR if you have severe symptoms of any kind, seek medical care at an emergency room.  . Please call ahead and tell them that you were seen by telemedicine and they have recommended that you have a face to face evaluation. . [facility] Emergency Department [address], [place], [place] [address] [phone]  . [facility] [place] Emergency Department [address], [place], [place] [address] [phone]  . [facility] Emergency Department [address], [place], [place] [address] [phone]  . [facility] Emergency Department [address], [place], [place] [address] [phone]  . [facility] Emergency Department [address], [place], [place] [address] [phone]  NOTE: If you entered your credit card information for this eVisit, you will not be charged. You may see a "hold" on your card for the $35 but that hold will drop off and you will not have a charge processed.   Your e-visit answers were reviewed by a board certified advanced clinical practitioner to complete your personal care plan.  Thank you for using e-Visits.  

## 2018-08-23 ENCOUNTER — Ambulatory Visit
Admission: RE | Admit: 2018-08-23 | Discharge: 2018-08-23 | Disposition: A | Discharge status: Home / Self Care | Payer: No Typology Code available for payment source | Attending: Family | Admitting: Family

## 2018-08-23 ENCOUNTER — Other Ambulatory Visit: Payer: Self-pay | Admitting: Family

## 2018-08-23 ENCOUNTER — Other Ambulatory Visit: Payer: Self-pay

## 2018-08-23 ENCOUNTER — Ambulatory Visit
Admission: RE | Admit: 2018-08-23 | Discharge: 2018-08-23 | Disposition: A | Discharge status: Home / Self Care | Payer: No Typology Code available for payment source | Source: Ambulatory Visit | Attending: Family | Admitting: Family

## 2018-08-23 DIAGNOSIS — R0602 Shortness of breath: Secondary | ICD-10-CM | POA: Diagnosis present

## 2018-12-14 ENCOUNTER — Telehealth (HOSPITAL_COMMUNITY): Payer: Self-pay | Admitting: Professional

## 2019-02-06 ENCOUNTER — Encounter: Payer: Self-pay | Admitting: Psychiatry

## 2019-02-06 ENCOUNTER — Other Ambulatory Visit: Payer: Self-pay

## 2019-02-06 ENCOUNTER — Ambulatory Visit (INDEPENDENT_AMBULATORY_CARE_PROVIDER_SITE_OTHER): Payer: No Typology Code available for payment source | Admitting: Psychiatry

## 2019-02-06 DIAGNOSIS — F410 Panic disorder [episodic paroxysmal anxiety] without agoraphobia: Secondary | ICD-10-CM | POA: Insufficient documentation

## 2019-02-06 DIAGNOSIS — F1021 Alcohol dependence, in remission: Secondary | ICD-10-CM | POA: Insufficient documentation

## 2019-02-06 DIAGNOSIS — F40298 Other specified phobia: Secondary | ICD-10-CM

## 2019-02-06 DIAGNOSIS — F4010 Social phobia, unspecified: Secondary | ICD-10-CM | POA: Diagnosis not present

## 2019-02-06 DIAGNOSIS — R41840 Attention and concentration deficit: Secondary | ICD-10-CM | POA: Diagnosis not present

## 2019-02-06 DIAGNOSIS — F17200 Nicotine dependence, unspecified, uncomplicated: Secondary | ICD-10-CM | POA: Insufficient documentation

## 2019-02-06 MED ORDER — HYDROXYZINE HCL 25 MG PO TABS: 25.0000 mg | ORAL_TABLET | Freq: Two times a day (BID) | ORAL | 1 refills | Status: DC | PRN

## 2019-02-06 MED ORDER — MIRTAZAPINE 15 MG PO TABS: 7.5000 mg | ORAL_TABLET | Freq: Every day | ORAL | 1 refills | Status: DC
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
## 2019-02-27 ENCOUNTER — Ambulatory Visit: Payer: No Typology Code available for payment source | Admitting: Psychiatry

## 2019-02-28 ENCOUNTER — Telehealth: Payer: Self-pay

## 2019-02-28 NOTE — Telephone Encounter (Signed)
Ok thanks 

## 2019-02-28 NOTE — Telephone Encounter (Signed)
According to status report from  attention specialist they have attempted 1x to contact pt.  

## 2019-04-25 IMAGING — CT CT ABD-PELV W/ CM
2 of 5 series · 16 of 46 positions shown, 18 images · IV contrast (iopamidol)
Comparison: None.

CLINICAL DATA: Pt states she had surgery on the [REDACTED]. Pt
stated she had removal of cyst on her ovary and adhesions. PT is
currently having headache for 9 days straight, pt states she also
feels fatigue. Pt stated she has left side pain.

EXAM:
CT ABDOMEN AND PELVIS WITH CONTRAST
TECHNIQUE: Multidetector CT imaging of the abdomen and pelvis was performed
using the standard protocol following bolus administration of
intravenous contrast.
CONTRAST:  100mL KQCBY8-G66 IOPAMIDOL (KQCBY8-G66) INJECTION 61%

[Series 2: routine abd/pel with · axial · 0.78mm/px · z∈[-680,-265]mm · 13 of 97 slices shown, 15 images]
[im 7/97  soft-tissue]
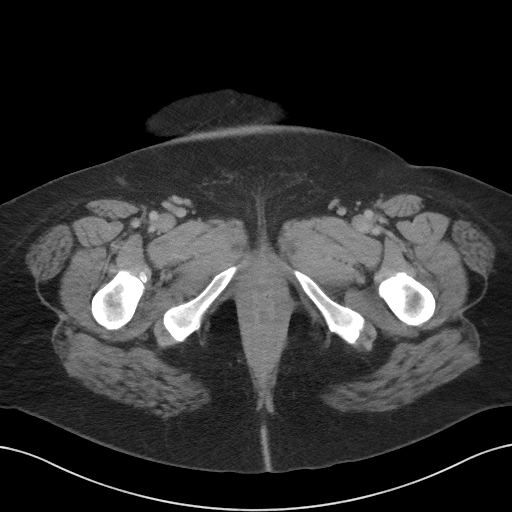
[im 7/97  bone]
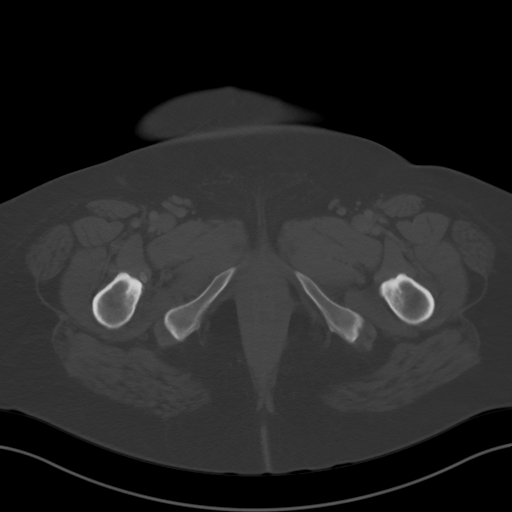
[im 14/97  soft-tissue]
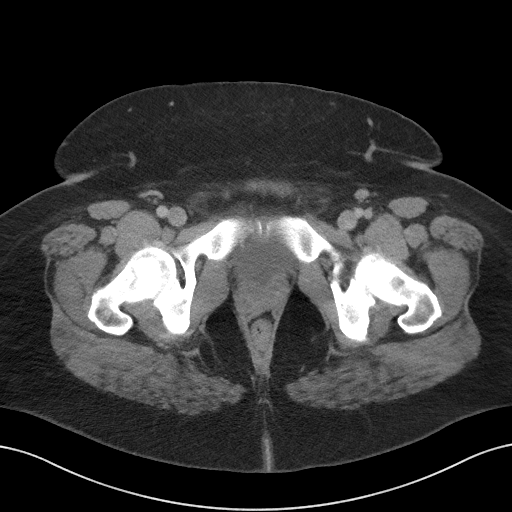
[im 21/97  soft-tissue]
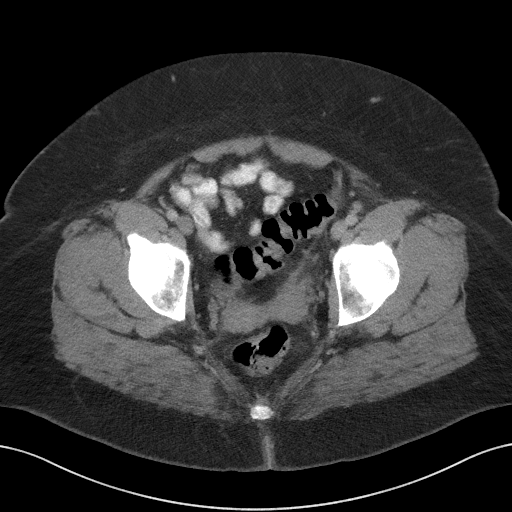
[im 28/97  soft-tissue]
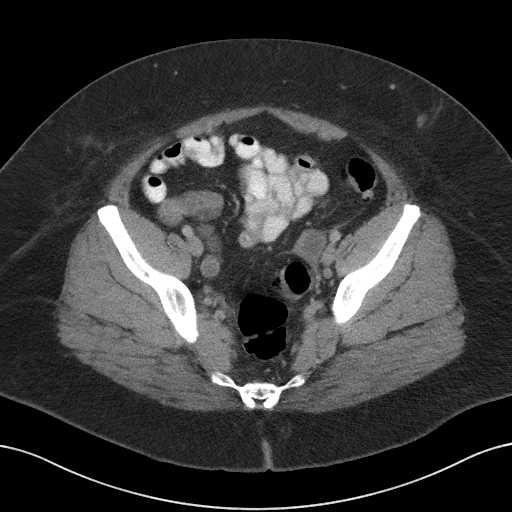
[im 35/97  soft-tissue]
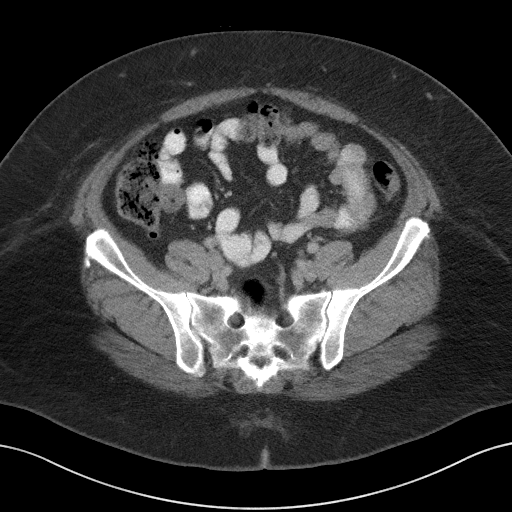
[im 42/97  soft-tissue]
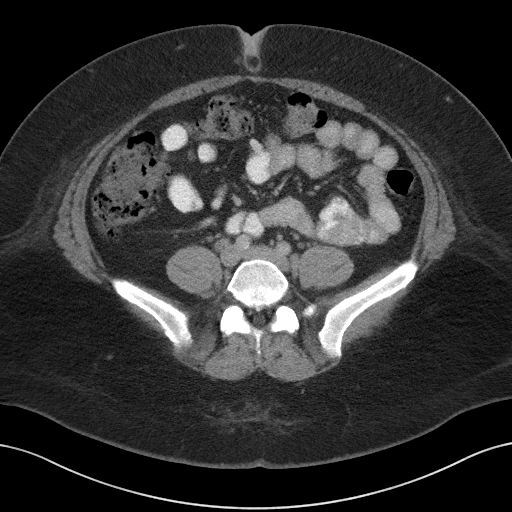
[im 49/97  soft-tissue]
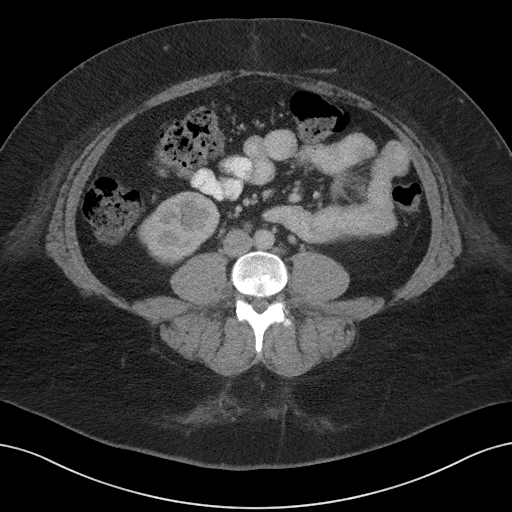
[im 55/97  soft-tissue]
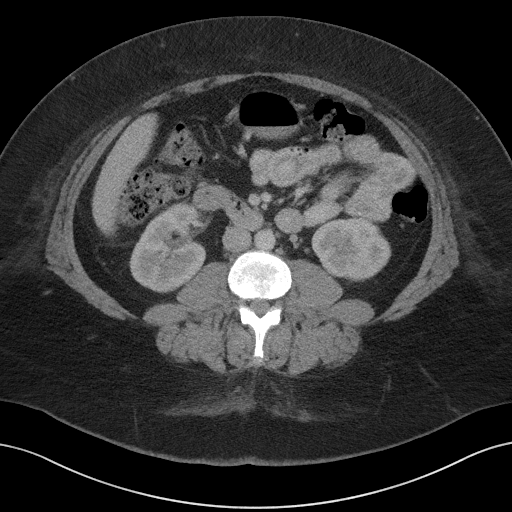
[im 62/97  soft-tissue]
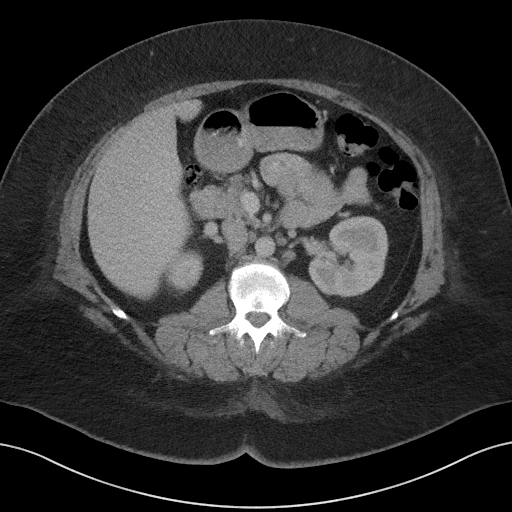
[im 62/97  bone]
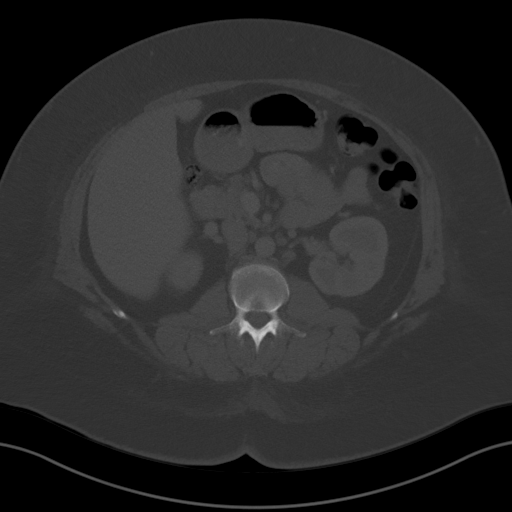
[im 69/97  soft-tissue]
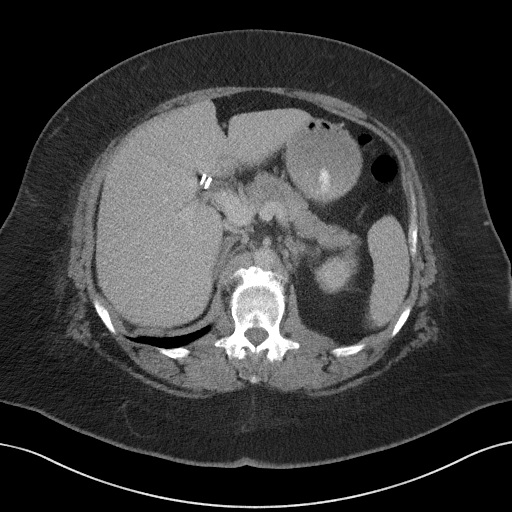
[im 76/97  soft-tissue]
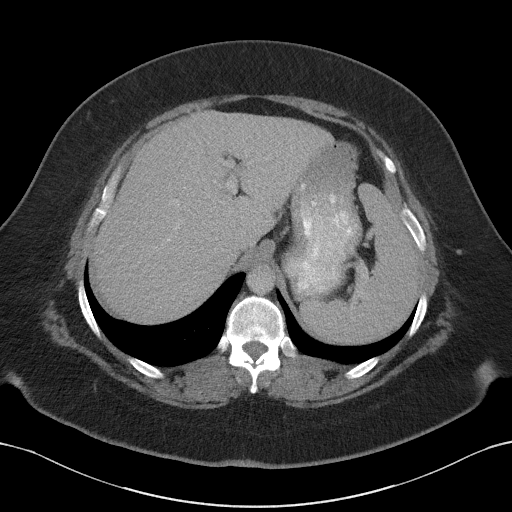
[im 83/97  soft-tissue]
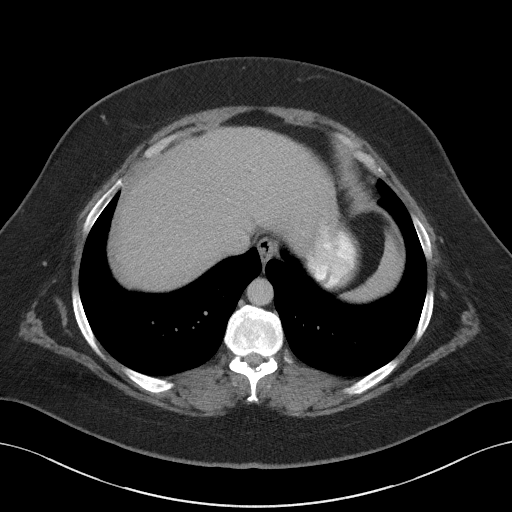
[im 90/97  soft-tissue]
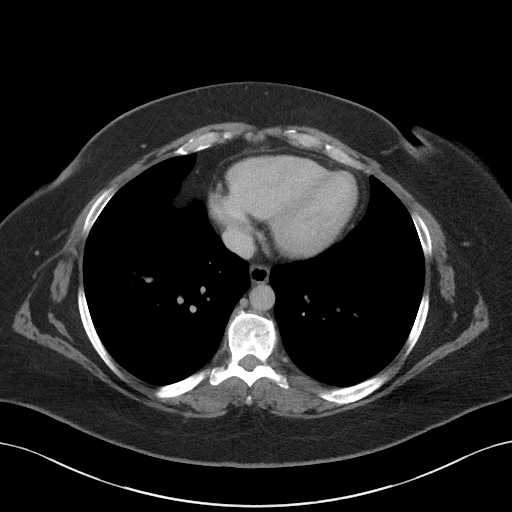

[Series 6: coronal st · coronal · 0.95mm/px · 3 of 99 slices shown]
[im 33/99  soft-tissue]
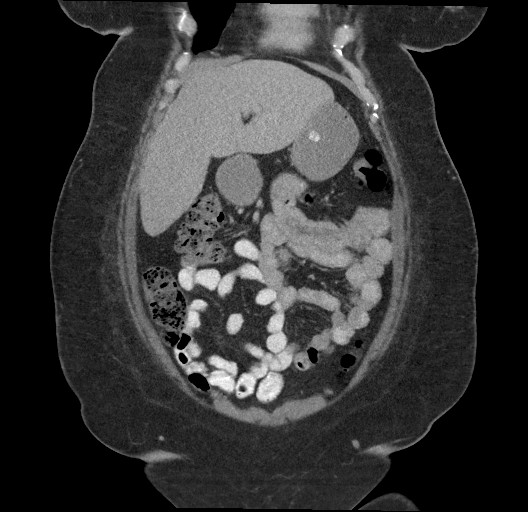
[im 44/99  soft-tissue]
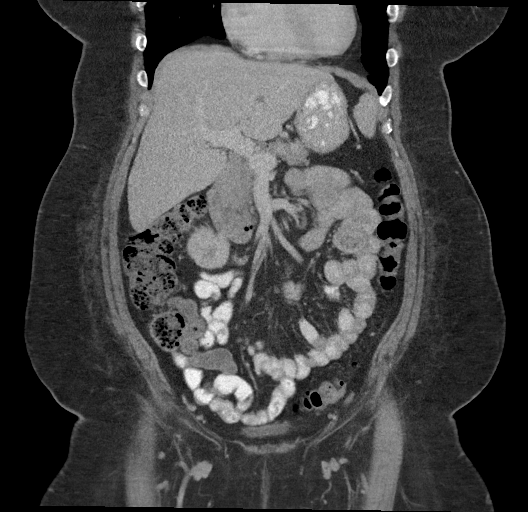
[im 55/99  soft-tissue]
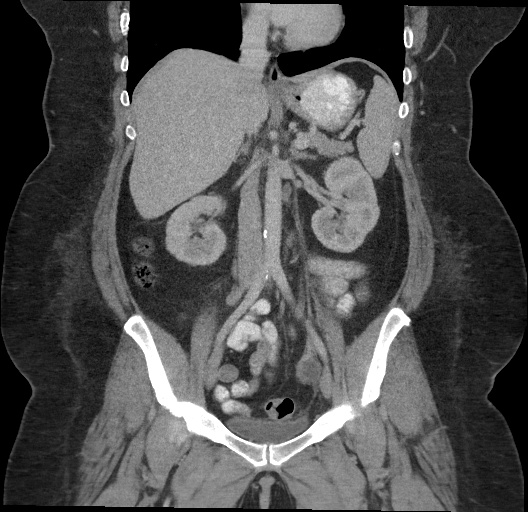

[16 of 46 positions shown; findings below may reference images not displayed]

FINDINGS: Lower chest: No acute abnormality.

Hepatobiliary: No focal liver abnormality is seen. No gallstones,
gallbladder wall thickening, or biliary dilatation.

Pancreas: Unremarkable. No pancreatic ductal dilatation or
surrounding inflammatory changes.

Spleen: Normal in size without focal abnormality.

Adrenals/Urinary Tract: Adrenal glands are unremarkable. Kidneys are
normal, without renal calculi, focal lesion, or hydronephrosis.
Bladder is unremarkable.

Stomach/Bowel: Stomach is within normal limits. Appendix appears
normal. No evidence of bowel wall thickening, distention, or
inflammatory changes.

Vascular/Lymphatic: No significant vascular findings are present. No
enlarged abdominal or pelvic lymph nodes.

Reproductive: Status post hysterectomy. No adnexal masses.

Other: No fluid collection or hematoma. Small fat containing
umbilical hernia.

Musculoskeletal: No acute osseous abnormality. No aggressive osseous
lesion. Degenerative disc disease with disc height loss at L1-2.
Multiple Schmorl's nodes of the lower thoracic and upper lumbar
spine.
IMPRESSION: 1. No acute abdominal or pelvic pathology.

## 2020-07-13 IMAGING — CR CHEST - 2 VIEW
1 series · 2 of 2 positions shown · non-contrast
Comparison: None.

CLINICAL DATA: Shortness of breath and cough

EXAM:
CHEST - 2 VIEW

[Series 1: dg chest 2 view · 0.14mm/px · 2 of 2 slices shown]
[im 1/2]
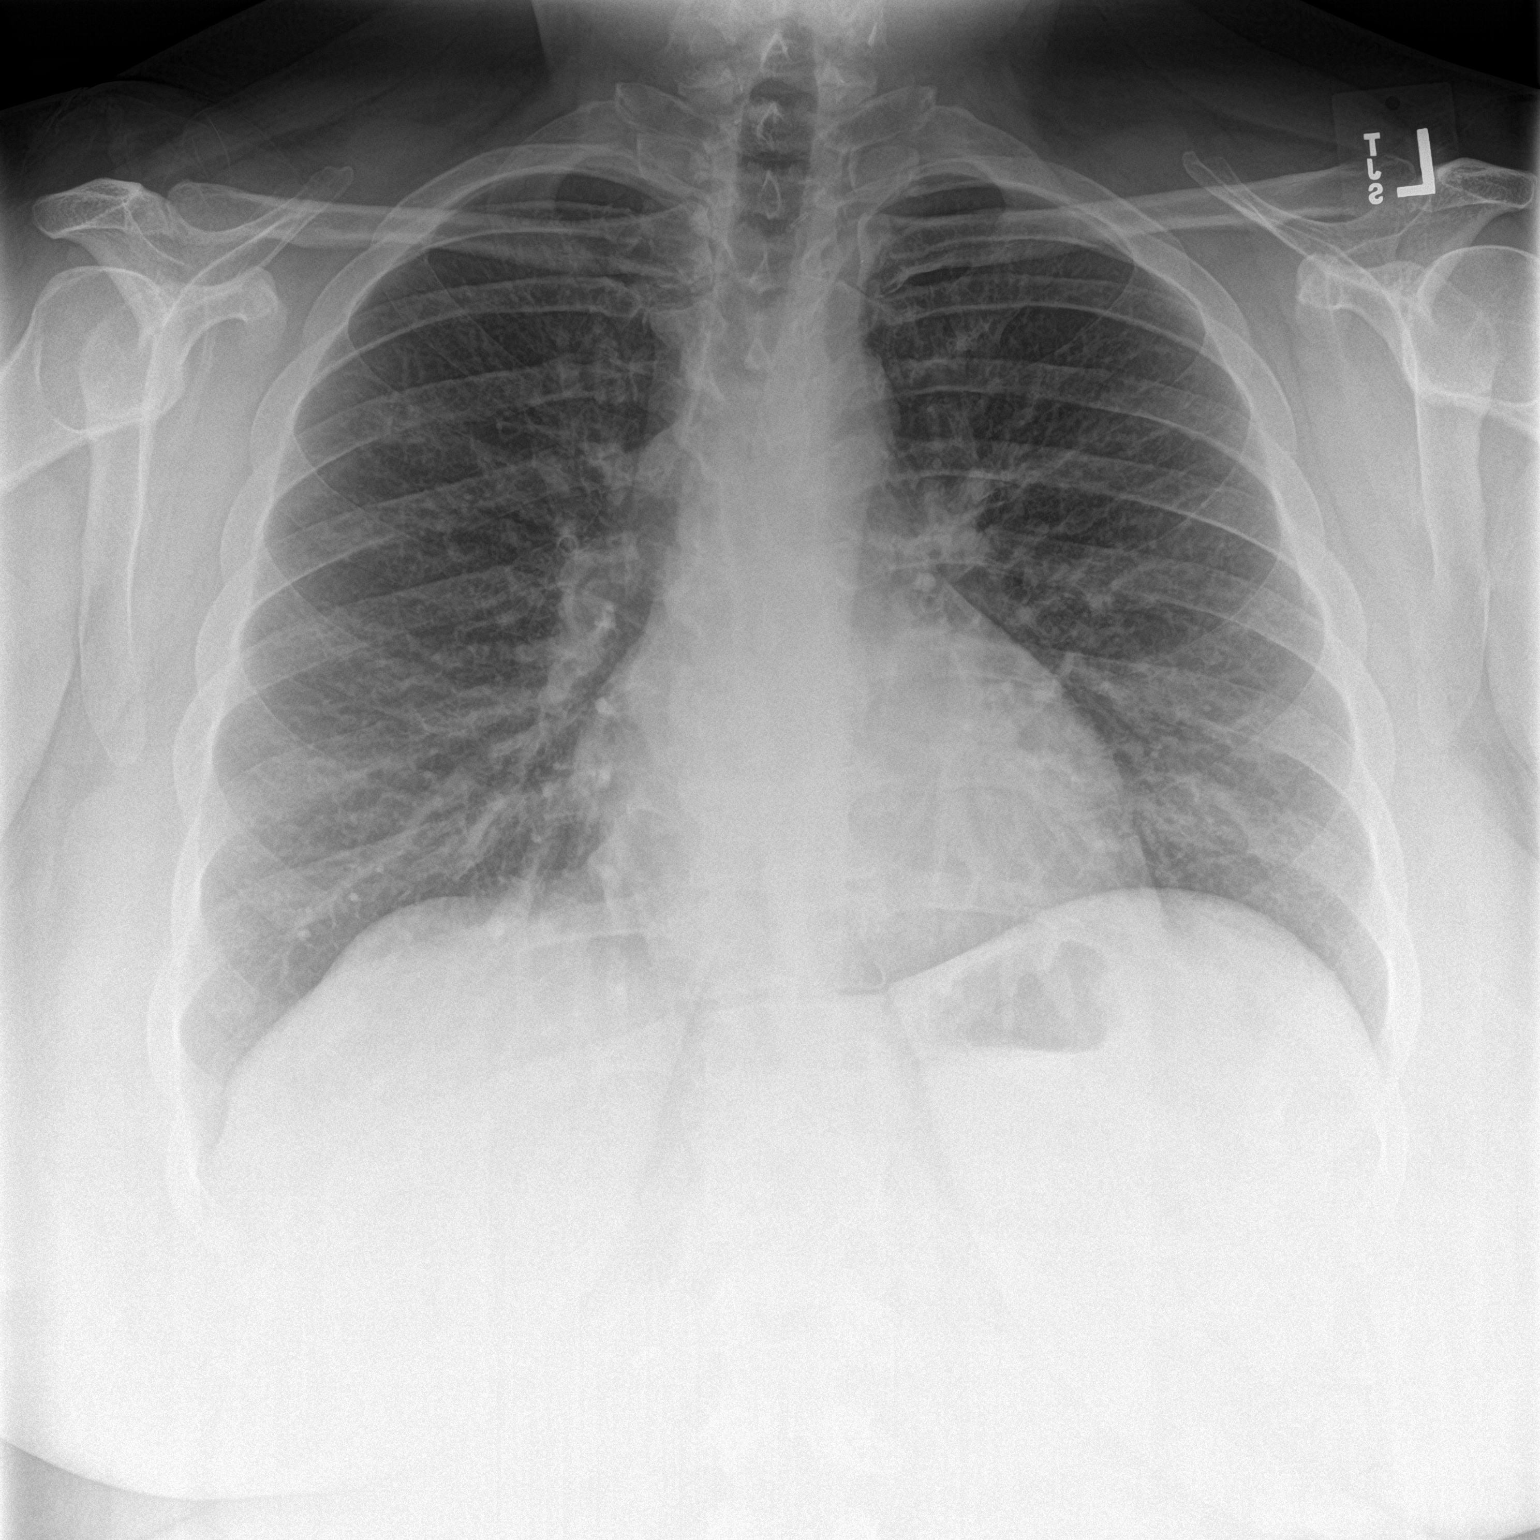
[im 2/2]
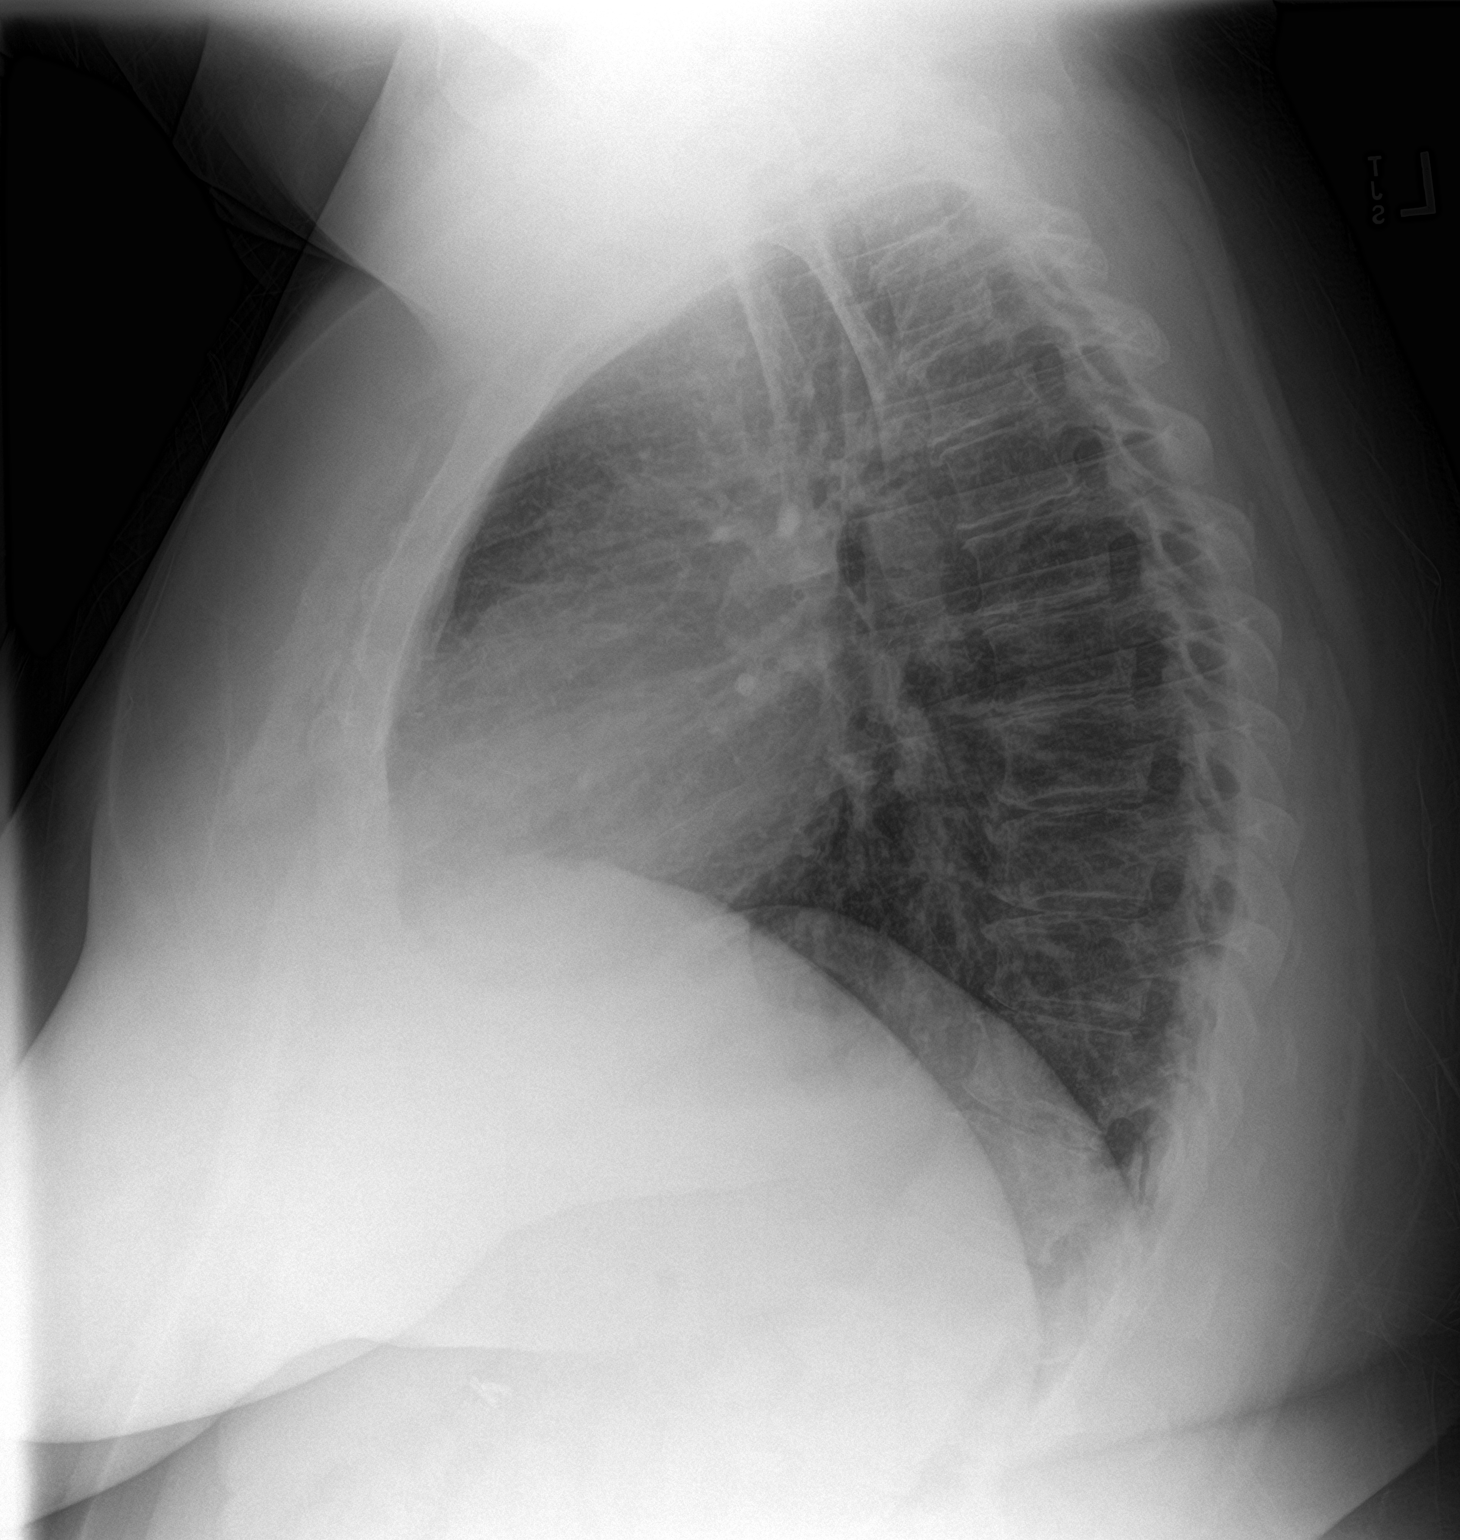

[2 of 2 positions shown; findings below may reference images not displayed]

FINDINGS: Lungs are clear. Heart size and pulmonary vascularity are normal. No
adenopathy. No bone lesions. No pneumothorax.
IMPRESSION: No edema or consolidation.

## 2021-10-03 ENCOUNTER — Other Ambulatory Visit
Admission: RE | Admit: 2021-10-03 | Discharge: 2021-10-03 | Disposition: A | Discharge status: Home / Self Care | Payer: BLUE CROSS/BLUE SHIELD | Source: Ambulatory Visit | Attending: Pulmonary Disease | Admitting: Pulmonary Disease

## 2021-10-03 DIAGNOSIS — R0602 Shortness of breath: Secondary | ICD-10-CM | POA: Insufficient documentation

## 2021-10-03 DIAGNOSIS — R0609 Other forms of dyspnea: Secondary | ICD-10-CM | POA: Diagnosis present

## 2021-10-03 LAB — D-DIMER, QUANTITATIVE: D-Dimer, Quant: 0.39 ug/mL-FEU (ref 0.00–0.50)

## 2021-12-26 ENCOUNTER — Other Ambulatory Visit: Payer: Self-pay | Admitting: Pulmonary Disease

## 2022-02-04 ENCOUNTER — Other Ambulatory Visit: Payer: Self-pay | Admitting: Pulmonary Disease

## 2022-02-04 DIAGNOSIS — R0600 Dyspnea, unspecified: Secondary | ICD-10-CM

## 2022-02-13 ENCOUNTER — Ambulatory Visit
Admission: RE | Admit: 2022-02-13 | Discharge: 2022-02-13 | Disposition: A | Discharge status: Home / Self Care | Payer: No Typology Code available for payment source | Source: Ambulatory Visit | Attending: Pulmonary Disease | Admitting: Pulmonary Disease

## 2022-02-13 DIAGNOSIS — R0600 Dyspnea, unspecified: Secondary | ICD-10-CM | POA: Diagnosis present

## 2022-05-31 ENCOUNTER — Encounter: Payer: Self-pay | Admitting: Intensive Care

## 2022-05-31 ENCOUNTER — Emergency Department: Payer: No Typology Code available for payment source

## 2022-05-31 ENCOUNTER — Emergency Department
Admission: EM | Admit: 2022-05-31 | Discharge: 2022-05-31 | Disposition: A | Discharge status: Home / Self Care | Payer: No Typology Code available for payment source | Attending: Emergency Medicine | Admitting: Emergency Medicine

## 2022-05-31 ENCOUNTER — Other Ambulatory Visit: Payer: Self-pay

## 2022-05-31 DIAGNOSIS — R42 Dizziness and giddiness: Secondary | ICD-10-CM

## 2022-05-31 DIAGNOSIS — R079 Chest pain, unspecified: Secondary | ICD-10-CM | POA: Diagnosis present

## 2022-05-31 LAB — PROTIME-INR
INR: 1 (ref 0.8–1.2)
Prothrombin Time: 13.1 seconds (ref 11.4–15.2)

## 2022-05-31 LAB — BASIC METABOLIC PANEL
Anion gap: 10 (ref 5–15)
BUN: 17 mg/dL (ref 6–20)
CO2: 26 mmol/L (ref 22–32)
Calcium: 9.5 mg/dL (ref 8.9–10.3)
Chloride: 103 mmol/L (ref 98–111)
Creatinine, Ser: 0.86 mg/dL (ref 0.44–1.00)
GFR, Estimated: >60 mL/min (ref >60)
Glucose, Bld: 103 mg/dL — ABNORMAL HIGH (ref 70–99)
Potassium: 4.5 mmol/L (ref 3.5–5.1)
Sodium: 139 mmol/L (ref 135–145)

## 2022-05-31 LAB — CBC
HCT: 44.4 % (ref 36.0–46.0)
Hemoglobin: 14.3 g/dL (ref 12.0–15.0)
MCH: 27.8 pg (ref 26.0–34.0)
MCHC: 32.2 g/dL (ref 30.0–36.0)
MCV: 86.4 fL (ref 80.0–100.0)
Platelets: 355 10*3/uL (ref 150–400)
RBC: 5.14 MIL/uL — ABNORMAL HIGH (ref 3.87–5.11)
RDW: 13.9 % (ref 11.5–15.5)
WBC: 10.4 10*3/uL (ref 4.0–10.5)
nRBC: 0 % (ref 0.0–0.2)

## 2022-05-31 LAB — TROPONIN I (HIGH SENSITIVITY): Troponin I (High Sensitivity): 2 ng/L (ref <18)

## 2022-05-31 NOTE — ED Provider Notes (Signed)
[facility] Provider Note  Patient Contact: 5:31 PM (approximate)   History   Dizziness and Chest Pain   HPI  Khylah Iseman is a 47 y.o. female who presents the emergency department complaining of intermittent dizziness, feeling off, feeling cold, intermittent chest pain.  Patient states that she has had symptoms for greater than a month.  She has symptoms every day though not all symptoms are present every day.  Patient has seen her primary care who has done labs which reportedly were normal.  Patient went to urgent care today and given the fact that she mention chest pain she was referred to the ED.  Patient states that she has a cardiac history in her family though she has never had cardiac issues personally.  The medical record reveals that patient has had workup from cardiology with imaging, echo with no acute findings.  Patient states that she has not been using any medications as the symptoms seem to change daily.  No nasal congestion, sore throat, cough, abdominal pain, emesis, diarrhea, urinary changes.    Physical Exam   Triage Vital Signs: ED Triage Vitals  Enc Vitals Group     BP 05/31/22 1451 123/79     Pulse Rate 05/31/22 1451 82     Resp 05/31/22 1451 20     Temp 05/31/22 1451 98.6 F (37 C)     Temp Source 05/31/22 1451 Oral     SpO2 05/31/22 1451 100 %     Weight 05/31/22 1456 299 lb (135.6 kg)     Height 05/31/22 1456 5' 4"$  (1.626 m)     Head Circumference --      Peak Flow --      Pain Score 05/31/22 1507 0     Pain Loc --      Pain Edu? --      Excl. in [place]? --     Most recent vital signs: Vitals:   05/31/22 1451 05/31/22 1730  BP: 123/79 127/71  Pulse: 82 72  Resp: 20   Temp: 98.6 F (37 C)   SpO2: 100% 97%     General: Alert and in no acute distress. Eyes:  PERRL. EOMI. ENT:      Ears:       Nose: No congestion/rhinnorhea.      Mouth/Throat: Mucous membranes are moist.  Cardiovascular:  Good peripheral  perfusion Respiratory: Normal respiratory effort without tachypnea or retractions. Lungs CTAB. Good air entry to the bases with no decreased or absent breath sounds Musculoskeletal: Full range of motion to all extremities.  Neurologic:  No gross focal neurologic deficits are appreciated.  Skin:   No rash noted Other:   ED Results / Procedures / Treatments   Labs (all labs ordered are listed, but only abnormal results are displayed) Labs Reviewed  BASIC METABOLIC PANEL - Abnormal; Notable for the following components:      Result Value   Glucose, Bld 103 (*)    All other components within normal limits  CBC - Abnormal; Notable for the following components:   RBC 5.14 (*)    All other components within normal limits  PROTIME-INR  TROPONIN I (HIGH SENSITIVITY)  TROPONIN I (HIGH SENSITIVITY)     EKG   ED ECG REPORT I, Charline Bills Lelia Jons,  personally viewed and interpreted this ECG.   Date: 05/31/2022  EKG Time: 1514 hrs.  Rate: 84 bpm  Rhythm: unchanged from previous tracings, normal sinus rhythm, sinus arrhythmia  Axis: Normal  axis  Intervals:none  ST&T Change: No ST elevation or depression noted  Normal sinus rhythm, sinus arrhythmia.  No STEMI.  No changes from previous EKG   RADIOLOGY  I personally viewed, evaluated, and interpreted these images as part of my medical decision making, as well as reviewing the written report by the radiologist.  ED Provider Interpretation: No acute cardiopulmonary finding on chest x-ray  DG Chest 2 View  Result Date: 05/31/2022 CLINICAL DATA:  Lightheadedness for 1 month, chest ache EXAM: CHEST - 2 VIEW COMPARISON:  02/13/2022 FINDINGS: Frontal and lateral views of the chest demonstrate an unremarkable cardiac silhouette. No airspace disease, effusion, or pneumothorax. No acute bony abnormality. IMPRESSION: 1. No acute intrathoracic process. Electronically Signed   By: Randa Ngo M.D.   On: 05/31/2022 15:35     PROCEDURES:  Critical Care performed: No  Procedures   MEDICATIONS ORDERED IN ED: Medications - No data to display   IMPRESSION / MDM / [place] / ED COURSE  I reviewed the triage vital signs and the nursing notes.                                 Differential diagnosis includes, but is not limited to, CVA, ACS/STEMI, hypothyroidism, hyperthyroidism, anxiety, eustachian tube dysfunction, vertigo  Patient's presentation is most consistent with acute presentation with potential threat to life or bodily function.   Patient's diagnosis is consistent with lightheadedness, nonspecific chest pain.  Patient presents emergency department with intermittent symptoms for the past month.  Patient has already been seen by primary care with no abnormality in her labs including her thyroid panel.  Patient went to urgent care today and after mentioning chest pain she was referred to the ED.  Troponin, EKG, chest x-ray are reassuring.  Patient's labs are reassuring at this time.  Given the temperature changes, nonspecific dizziness, weakness, feeling like she is "foggy headed" I am more suspicious of her thyroid.  However she states that her recent thyroid labs were normal.  Today's labs are reassuring.  Neurologically intact.  Recommend following up with primary care or endocrinology.  Return precautions discussed with the patient.. . Patient is given ED precautions to return to the ED for any worsening or new symptoms.     FINAL CLINICAL IMPRESSION(S) / ED DIAGNOSES   Final diagnoses:  Lightheadedness     Rx / DC Orders   ED Discharge Orders     None        Note:  This document was prepared using Dragon voice recognition software and may include unintentional dictation errors.   Brynda Peon 05/31/22 Gardiner Coins, MD 05/31/22 [phone]

## 2022-05-31 NOTE — ED Triage Notes (Signed)
Patient c/o intermittent lightheadedness X1 month that has worsened. Reports small aches in her chest. C/o feeling cold constantly, hard time concentrating, and weakness.   Reports some blurry vision but no different than the last two years. Was told she had a stigmatism but does not wear prescription glasses prescribed to her

## 2022-09-29 ENCOUNTER — Other Ambulatory Visit: Payer: Self-pay | Admitting: Physician Assistant

## 2022-09-29 DIAGNOSIS — R748 Abnormal levels of other serum enzymes: Secondary | ICD-10-CM

## 2022-10-09 ENCOUNTER — Ambulatory Visit
Admission: RE | Admit: 2022-10-09 | Discharge: 2022-10-09 | Disposition: A | Discharge status: Home / Self Care | Payer: No Typology Code available for payment source | Source: Ambulatory Visit | Attending: Physician Assistant | Admitting: Physician Assistant

## 2022-10-09 DIAGNOSIS — R748 Abnormal levels of other serum enzymes: Secondary | ICD-10-CM | POA: Insufficient documentation

## 2022-11-05 ENCOUNTER — Other Ambulatory Visit: Payer: Self-pay | Admitting: Student

## 2022-11-05 DIAGNOSIS — R42 Dizziness and giddiness: Secondary | ICD-10-CM

## 2022-11-12 ENCOUNTER — Emergency Department
Admission: EM | Admit: 2022-11-12 | Discharge: 2022-11-12 | Disposition: A | Discharge status: Home / Self Care | Payer: No Typology Code available for payment source | Attending: Emergency Medicine | Admitting: Emergency Medicine

## 2022-11-12 ENCOUNTER — Other Ambulatory Visit: Payer: Self-pay

## 2022-11-12 ENCOUNTER — Emergency Department: Payer: No Typology Code available for payment source

## 2022-11-12 DIAGNOSIS — R42 Dizziness and giddiness: Secondary | ICD-10-CM | POA: Diagnosis present

## 2022-11-12 LAB — BASIC METABOLIC PANEL
Anion gap: 13 (ref 5–15)
BUN: 20 mg/dL (ref 6–20)
CO2: 22 mmol/L (ref 22–32)
Calcium: 9.4 mg/dL (ref 8.9–10.3)
Chloride: 102 mmol/L (ref 98–111)
Creatinine, Ser: 0.83 mg/dL (ref 0.44–1.00)
GFR, Estimated: >60 mL/min (ref >60)
Glucose, Bld: 87 mg/dL (ref 70–99)
Potassium: 3.8 mmol/L (ref 3.5–5.1)
Sodium: 137 mmol/L (ref 135–145)

## 2022-11-12 LAB — CBC
HCT: 42.3 % (ref 36.0–46.0)
Hemoglobin: 14.1 g/dL (ref 12.0–15.0)
MCH: 28.4 pg (ref 26.0–34.0)
MCHC: 33.3 g/dL (ref 30.0–36.0)
MCV: 85.1 fL (ref 80.0–100.0)
Platelets: 327 10*3/uL (ref 150–400)
RBC: 4.97 MIL/uL (ref 3.87–5.11)
RDW: 13.6 % (ref 11.5–15.5)
WBC: 11.5 10*3/uL — ABNORMAL HIGH (ref 4.0–10.5)
nRBC: 0 % (ref 0.0–0.2)

## 2022-11-12 LAB — TROPONIN I (HIGH SENSITIVITY): Troponin I (High Sensitivity): 4 ng/L (ref <18)

## 2022-11-12 NOTE — ED Triage Notes (Signed)
Pt arrives via POV w/ c/o abnormal EKG, sent by dr. Rock Nephew reports she was seen by dr today for dizziness, weakness since January. Pt states she has been seen by ENT for this as well.

## 2022-11-12 NOTE — ED Provider Notes (Signed)
[facility] Provider Note    Event Date/Time   First MD Initiated Contact with Patient 11/12/22 Paulo Fruit     (approximate)   History   Abnormal ECG (Sent by pcp) and Dizziness   HPI  Dana Shaffer is a 47 y.o. female who presented to her PCP today with dizziness, had an EKG done there which was found to have abnormal Q waves so referred to the emergency department.  Patient denies chest pain at this time.  Overall she reports she has been having dizziness since January     Physical Exam   Triage Vital Signs: ED Triage Vitals  Encounter Vitals Group     BP 11/12/22 1734 (!) 162/79     Systolic BP Percentile --      Diastolic BP Percentile --      Pulse Rate 11/12/22 1734 88     Resp 11/12/22 1734 17     Temp 11/12/22 1734 98.7 F (37.1 C)     Temp Source 11/12/22 1734 Oral     SpO2 11/12/22 1734 99 %     Weight 11/12/22 1729 127.5 kg (281 lb)     Height 11/12/22 1729 1.626 m (5\' 4" )     Head Circumference --      Peak Flow --      Pain Score 11/12/22 1728 4     Pain Loc --      Pain Education --      Exclude from Growth Chart --     Most recent vital signs: Vitals:   11/12/22 1734  BP: (!) 162/79  Pulse: 88  Resp: 17  Temp: 98.7 F (37.1 C)  SpO2: 99%     General: Awake, no distress.  CV:  Good peripheral perfusion.  Resp:  Normal effort.  Abd:  No distention.  Other:     ED Results / Procedures / Treatments   Labs (all labs ordered are listed, but only abnormal results are displayed) Labs Reviewed  CBC - Abnormal; Notable for the following components:      Result Value   WBC 11.5 (*)    All other components within normal limits  BASIC METABOLIC PANEL  TROPONIN I (HIGH SENSITIVITY)  TROPONIN I (HIGH SENSITIVITY)     EKG  ED ECG REPORT I, Jene Every, the attending physician, personally viewed and interpreted this ECG.  Date: 11/12/2022  Rhythm: normal sinus rhythm QRS Axis: normal Intervals: normal ST/T Wave  abnormalities: normal Narrative Interpretation: no evidence of acute ischemia    RADIOLOGY     PROCEDURES:  Critical Care performed:   Procedures   MEDICATIONS ORDERED IN ED: Medications - No data to display   IMPRESSION / MDM / ASSESSMENT AND PLAN / ED COURSE  I reviewed the triage vital signs and the nursing notes. Patient's presentation is most consistent with acute illness / injury with system symptoms.  Patient presents with reports of abnormal EKG.  She is asymptomatic at this time, no chest pain.  Well-appearing and in no acute distress.  Her EKG here is quite normal.  No abnormal Q waves noted.  Lab work obtained which is reassuring, normal high sensitive troponin  No indication for further emergent workup, patient's PCP has referred to cardiology for dizziness.  Patient agrees with this plan.        FINAL CLINICAL IMPRESSION(S) / ED DIAGNOSES   Final diagnoses:  Dizziness     Rx / DC Orders   ED Discharge Orders  None        Note:  This document was prepared using Dragon voice recognition software and may include unintentional dictation errors.   Jene Every, MD 11/12/22 1911

## 2022-11-23 ENCOUNTER — Ambulatory Visit
Admission: RE | Admit: 2022-11-23 | Discharge: 2022-11-23 | Disposition: A | Discharge status: Home / Self Care | Payer: No Typology Code available for payment source | Source: Ambulatory Visit | Attending: Student | Admitting: Student

## 2022-11-23 DIAGNOSIS — R42 Dizziness and giddiness: Secondary | ICD-10-CM

## 2022-11-23 MED ORDER — GADOPICLENOL 0.5 MMOL/ML IV SOLN
7.5000 mL | Freq: Once | INTRAVENOUS | Status: AC | PRN
  Administered 2022-11-23: 7.5 mL via INTRAVENOUS

## 2023-01-20 ENCOUNTER — Other Ambulatory Visit: Payer: Self-pay | Admitting: Neurology

## 2023-01-20 DIAGNOSIS — H538 Other visual disturbances: Secondary | ICD-10-CM

## 2023-01-20 DIAGNOSIS — M542 Cervicalgia: Secondary | ICD-10-CM

## 2023-02-01 ENCOUNTER — Ambulatory Visit
Admission: RE | Admit: 2023-02-01 | Discharge: 2023-02-01 | Disposition: A | Discharge status: Home / Self Care | Payer: No Typology Code available for payment source | Source: Ambulatory Visit | Attending: Neurology | Admitting: Neurology

## 2023-02-01 DIAGNOSIS — M542 Cervicalgia: Secondary | ICD-10-CM

## 2023-02-01 DIAGNOSIS — H538 Other visual disturbances: Secondary | ICD-10-CM

## 2023-03-03 ENCOUNTER — Other Ambulatory Visit: Payer: Self-pay | Admitting: Physical Medicine & Rehabilitation

## 2023-03-03 DIAGNOSIS — M5412 Radiculopathy, cervical region: Secondary | ICD-10-CM

## 2023-03-08 ENCOUNTER — Other Ambulatory Visit: Payer: Self-pay | Admitting: Family Medicine

## 2023-03-08 DIAGNOSIS — Z1231 Encounter for screening mammogram for malignant neoplasm of breast: Secondary | ICD-10-CM

## 2023-03-26 ENCOUNTER — Ambulatory Visit
Admission: RE | Admit: 2023-03-26 | Discharge: 2023-03-26 | Disposition: A | Discharge status: Home / Self Care | Payer: No Typology Code available for payment source | Source: Ambulatory Visit | Attending: Family Medicine | Admitting: Family Medicine

## 2023-03-26 DIAGNOSIS — Z1231 Encounter for screening mammogram for malignant neoplasm of breast: Secondary | ICD-10-CM | POA: Diagnosis present

## 2023-04-15 ENCOUNTER — Other Ambulatory Visit: Payer: Self-pay | Admitting: Family Medicine

## 2023-04-15 DIAGNOSIS — R1084 Generalized abdominal pain: Secondary | ICD-10-CM

## 2023-04-15 DIAGNOSIS — R634 Abnormal weight loss: Secondary | ICD-10-CM

## 2023-04-18 ENCOUNTER — Emergency Department
Admission: EM | Admit: 2023-04-18 | Discharge: 2023-04-18 | Discharge status: Against Medical Advice | Payer: No Typology Code available for payment source | Attending: Emergency Medicine | Admitting: Emergency Medicine

## 2023-04-18 ENCOUNTER — Other Ambulatory Visit: Payer: Self-pay

## 2023-04-18 ENCOUNTER — Emergency Department: Payer: No Typology Code available for payment source

## 2023-04-18 DIAGNOSIS — Z5321 Procedure and treatment not carried out due to patient leaving prior to being seen by health care provider: Secondary | ICD-10-CM | POA: Insufficient documentation

## 2023-04-18 DIAGNOSIS — R200 Anesthesia of skin: Secondary | ICD-10-CM | POA: Insufficient documentation

## 2023-04-18 DIAGNOSIS — R253 Fasciculation: Secondary | ICD-10-CM | POA: Diagnosis not present

## 2023-04-18 DIAGNOSIS — R1033 Periumbilical pain: Secondary | ICD-10-CM | POA: Diagnosis present

## 2023-04-18 DIAGNOSIS — M79606 Pain in leg, unspecified: Secondary | ICD-10-CM | POA: Diagnosis not present

## 2023-04-18 DIAGNOSIS — R202 Paresthesia of skin: Secondary | ICD-10-CM | POA: Insufficient documentation

## 2023-04-18 LAB — URINALYSIS, ROUTINE W REFLEX MICROSCOPIC
Bilirubin Urine: NEGATIVE
Glucose, UA: NEGATIVE mg/dL
Hgb urine dipstick: NEGATIVE
Ketones, ur: NEGATIVE mg/dL
Leukocytes,Ua: NEGATIVE
Nitrite: NEGATIVE
Protein, ur: NEGATIVE mg/dL
Specific Gravity, Urine: 1.015 (ref 1.005–1.030)
pH: 6 (ref 5.0–8.0)

## 2023-04-18 LAB — COMPREHENSIVE METABOLIC PANEL
ALT: 16 U/L (ref 0–44)
AST: 13 U/L — ABNORMAL LOW (ref 15–41)
Albumin: 4.3 g/dL (ref 3.5–5.0)
Alkaline Phosphatase: 67 U/L (ref 38–126)
Anion gap: 11 (ref 5–15)
BUN: 21 mg/dL — ABNORMAL HIGH (ref 6–20)
CO2: 23 mmol/L (ref 22–32)
Calcium: 9.2 mg/dL (ref 8.9–10.3)
Chloride: 102 mmol/L (ref 98–111)
Creatinine, Ser: 0.66 mg/dL (ref 0.44–1.00)
GFR, Estimated: >60 mL/min (ref >60)
Glucose, Bld: 104 mg/dL — ABNORMAL HIGH (ref 70–99)
Potassium: 4 mmol/L (ref 3.5–5.1)
Sodium: 136 mmol/L (ref 135–145)
Total Bilirubin: 0.6 mg/dL (ref 0.0–1.2)
Total Protein: 7.4 g/dL (ref 6.5–8.1)

## 2023-04-18 LAB — CBC
HCT: 43.9 % (ref 36.0–46.0)
Hemoglobin: 14.5 g/dL (ref 12.0–15.0)
MCH: 28 pg (ref 26.0–34.0)
MCHC: 33 g/dL (ref 30.0–36.0)
MCV: 84.9 fL (ref 80.0–100.0)
Platelets: 309 10*3/uL (ref 150–400)
RBC: 5.17 MIL/uL — ABNORMAL HIGH (ref 3.87–5.11)
RDW: 13.2 % (ref 11.5–15.5)
WBC: 8 10*3/uL (ref 4.0–10.5)
nRBC: 0 % (ref 0.0–0.2)

## 2023-04-18 LAB — LIPASE, BLOOD: Lipase: 24 U/L (ref 11–51)

## 2023-04-18 NOTE — ED Triage Notes (Addendum)
 Pt comes with c/o belly pain, muscle twitching, numbness tingling. Pt states this all started over week ago. Pt states flank and abdomen has been very sore. Pt states sharp shooting pain. Pt states some face numbness but has resolved from week ago. Pt states leg cramping., pt states she went to UC and was advised to come here but she didn't  Pt states you name it she has it happening.   Pt states no pain or burning with urination but difficult to get started. Pt also states she feels cold over her body.

## 2023-04-19 ENCOUNTER — Emergency Department
Admission: EM | Admit: 2023-04-19 | Discharge: 2023-04-19 | Disposition: A | Discharge status: Home / Self Care | Payer: No Typology Code available for payment source | Attending: Emergency Medicine | Admitting: Emergency Medicine

## 2023-04-19 DIAGNOSIS — E039 Hypothyroidism, unspecified: Secondary | ICD-10-CM | POA: Diagnosis not present

## 2023-04-19 DIAGNOSIS — R202 Paresthesia of skin: Secondary | ICD-10-CM

## 2023-04-19 NOTE — ED Triage Notes (Signed)
 Pt reports tingling all over for 2 weeks, muscle twiching, feeling cold, intermittent abd pain, slow urination. States "I dont think my blood flow is going throw my body" NAD noted, ambulatory Pt LWBS yesterday, labs visible

## 2023-04-19 NOTE — ED Notes (Signed)
 See triage notes. Patient c/o multiple issues. Patient c/o tingling and numbness to the left side that has resolved, burning of the hands and feet, left side abdominal/flank pain, vibrating chest, feeling like her organs are pushed into her chest, and feeling like there is something under her sternum.

## 2023-04-19 NOTE — Discharge Instructions (Signed)
 Please call the number provided for rheumatology to arrange a follow-up.  Return to the emergency department for any symptom personally concerning to yourself.

## 2023-04-19 NOTE — ED Provider Notes (Signed)
 [facility] Provider Note    Event Date/Time   First MD Initiated Contact with Patient 04/19/23 0830     (approximate)  History   Chief Complaint: multiple complaints  HPI  Dana Shaffer is a 48 y.o. female with a past medical history of anemia, anxiety, gastric reflux, hypothyroidism, presents to the emergency department with multiple complaints.  According to the patient for the last 2 to 3 weeks she has been experiencing tingling throughout her body mostly in her hands and her feet.  She states she is worried that she is not getting enough circulation through her body.  She is having muscle twitches at times feels fatigued and very cold.  Patient also states for the past year or so she has been experiencing intermittent abdominal pain.  Patient states she has been to her doctor she has been to a physiatrist she has been to a neurologist and no one is able to tell her what is going on.  Patient states she has been on clonazepam in the past for anxiety but she believes this medicine was not helping and she stopped it sometime ago and yet continues to have symptoms.  Physical Exam   Triage Vital Signs: ED Triage Vitals  Encounter Vitals Group     BP 04/19/23 0811 (!) 142/76     Systolic BP Percentile --      Diastolic BP Percentile --      Pulse Rate 04/19/23 0811 89     Resp 04/19/23 0808 18     Temp 04/19/23 0808 98.2 F (36.8 C)     Temp src --      SpO2 04/19/23 0811 100 %     Weight 04/19/23 0811 257 lb 15 oz (117 kg)     Height 04/19/23 0811 5' 4 (1.626 m)     Head Circumference --      Peak Flow --      Pain Score 04/19/23 0811 3     Pain Loc --      Pain Education --      Exclude from Growth Chart --     Most recent vital signs: Vitals:   04/19/23 0808 04/19/23 0811  BP:  (!) 142/76  Pulse:  89  Resp: 18   Temp: 98.2 F (36.8 C)   SpO2:  100%    General: Awake, no distress.  CV:  Good peripheral perfusion.  Regular rate and rhythm   Resp:  Normal effort.  Equal breath sounds bilaterally.  Abd:  No distention.  Soft, nontender.  No rebound or guarding. Other:  Equal grip strength bilaterally, 5/5 strength in all extremities, cranial nerves intact, no pronator drift   ED Results / Procedures / Treatments   RADIOLOGY  I reviewed and interpreted CT images of the head from yesterday showing no bleed.  Radiology read the CT scan is negative.   MEDICATIONS ORDERED IN ED: Medications - No data to display   IMPRESSION / MDM / ASSESSMENT AND PLAN / ED COURSE  I reviewed the triage vital signs and the nursing notes.  Patient's presentation is most consistent with acute presentation with potential threat to life or bodily function.  Patient presents to the emergency department with vague complaints of several weeks of tingling throughout her body, feeling cold worried that she has low blood circulation.  Patient's workup from her lab work yesterday is reassuring with a normal CBC, normal chemistry, normal urinalysis and normal CT scan of the head.  Patient has a reassuring physical exam normal neurological exam.  I discussed in detail with the patient and her symptoms, they seem most suggestive of anxiety however the patient is adamant that it is not anxiety.  She did want to see a rheumatologist to rule out an autoimmune disorder we will refer to rheumatology.  Patient agreeable to plan of care.  FINAL CLINICAL IMPRESSION(S) / ED DIAGNOSES   Paresthesias   Note:  This document was prepared using Dragon voice recognition software and may include unintentional dictation errors.   Dorothyann Drivers, MD 04/19/23 [phone]

## 2023-04-22 ENCOUNTER — Ambulatory Visit
Admission: RE | Admit: 2023-04-22 | Discharge: 2023-04-22 | Disposition: A | Discharge status: Home / Self Care | Payer: No Typology Code available for payment source | Source: Ambulatory Visit | Attending: Family Medicine | Admitting: Family Medicine

## 2023-04-22 DIAGNOSIS — R634 Abnormal weight loss: Secondary | ICD-10-CM | POA: Diagnosis present

## 2023-04-22 DIAGNOSIS — R1084 Generalized abdominal pain: Secondary | ICD-10-CM | POA: Diagnosis present

## 2023-04-22 MED ORDER — IOHEXOL 300 MG/ML  SOLN
100.0000 mL | Freq: Once | INTRAMUSCULAR | Status: AC | PRN
  Administered 2023-04-22: 100 mL via INTRAVENOUS

## 2023-05-27 ENCOUNTER — Other Ambulatory Visit: Payer: Self-pay

## 2023-05-31 NOTE — Progress Notes (Unsigned)
Celso Amy, PA-C [address]  [address]  [place], [place] [address]  Main: [phone]  Fax: [phone]   Gastroenterology Consultation  Referring Provider:     Abram Sander, MD Primary Care Physician:  Abram Sander, MD Primary Gastroenterologist:  Celso Amy, PA-C / Dr. Wyline Mood   Reason for Consultation:     Abdominal pain, weight loss        HPI:   Dana Shaffer is a 48 y.o. y/o female referred for consultation & management  by Abram Sander, MD. here to evaluate abdominal pain and weight loss.  She has alternating episodes of diarrhea and constipation.  She has had intermittent abdominal pain for 1 year.  Abdominal pain has been located in the epigastrium, RUQ, and LUQ.  She describes upper abdominal bloating, belching, epigastric burning, nausea, and mid lower anterior chest pain.  She had negative cardiac evaluation including echocardiogram and stress PET scan.  She is not on PPI or H2 RB because they cause increased dizziness.  She has episodes of diarrhea alternating with constipation.  Denies melena or hematochezia.  No family history of GI cancers.  05/2022 weight 299 pounds.  11/2022 weight 281 pounds.  04/18/2023 weight 260 pounds.  04/19/2023 weight 257.94.  BMI 44.27.  She has lost approximately 40 pounds in the past year.  She went on high-protein low-carb diet 1 year ago to help with her blood sugars.  A1c improved to 5.5.  04/25/2023: CT abdomen pelvis with contrast: No acute abnormality.  Diverticulosis but no diverticulitis.  There was an ill-defined hypodensity in the liver along the falciform ligament, possible fatty liver.  Patient is very worried about this.  Prior cholecystectomy.  No bile duct dilation.  Normal pancreas.  04/2023 negative head CT.  She has had episodes of dizziness and neuropathy for 1 year.  Underwent neurologic evaluation, unrevealing.  Had labs through her PCP which showed normal vitamin levels per patient report.  She has a lot of  anxiety.  04/18/2023 labs: Normal CBC, CMP, lipase.  Hemoglobin 14.5.  WBC 8.0.  Labs 11/2022 showed normal TSH and free T4.  02/2017 EGD at [facility]: Medium food in the stomach and duodenum.  Normal esophagus.   01/2017 colonoscopy: 3 mm tiny anal fissure.  Otherwise normal.  No polyps.  No evidence of IBD.  No biopsies.  Medical history of anemia, anxiety, gastric reflux, hypothyroidism.  Previous cholecystectomy, hysterectomy.  Past Medical History:  Diagnosis Date   Anemia    IRON INFUSION IN DEC 2018   Anxiety    Complication of anesthesia    AFTER HYSTERECTOMY, HAD TROUBLE BREATHING   Depression    GERD (gastroesophageal reflux disease)    RARE-NO MEDS   Hypothyroidism    Hypothyroidism    OSA (obstructive sleep apnea)    Seizures (HCC)    AS A CHILD X 5-6 TIMES   Sleep apnea     RECENTLY DX AND NO CPAP   Tachycardia    PTS PCP JUST TOOK PT OFF OF HER [facility] ON 05-11-17 THINKING THIS MAY BE CAUSING TACHYCARDIA    Past Surgical History:  Procedure Laterality Date   ABDOMINAL HYSTERECTOMY     CHOLECYSTECTOMY     COLONOSCOPY WITH ESOPHAGOGASTRODUODENOSCOPY (EGD)     LAPAROSCOPY N/A 05/21/2017   Procedure: Exploratory laporscropsy, lyses of adhesions, left cyst wall removal;  Surgeon: Ward, Elenora Fender, MD;  Location: [facility] ORS;  Service: Gynecology;  Laterality: N/A;  Lysis of Adhesions  THYROIDECTOMY     AGE 48    Prior to Admission medications   Medication Sig Start Date End Date Taking? Authorizing Provider  clonazePAM (KLONOPIN) 1 MG tablet Take 1 mg by mouth every morning. For anxiety. 04/14/17   [provider]  hydrOXYzine (ATARAX/VISTARIL) 25 MG tablet Take 1-2 tablets (25-50 mg total) by mouth 2 (two) times daily as needed for anxiety. 02/06/19   Jomarie Longs, MD  ibuprofen (ADVIL,MOTRIN) 800 MG tablet Take 1 tablet (800 mg total) by mouth every 6 (six) hours. 05/21/17   Ward, Elenora Fender, MD  Insulin Pen Needle (TRUEPLUS PEN NEEDLES) 29G X MISC  12/13/20    [provider]  levothyroxine (SYNTHROID) 137 MCG tablet TK 1 T PO D FOR HYPOTHYROID 11/23/18   [provider]  mirtazapine (REMERON) 15 MG tablet Take 0.5-1 tablets (7.5-15 mg total) by mouth at bedtime. Mood , sleep 02/06/19   Jomarie Longs, MD    Family History  Problem Relation Age of Onset   ADD / ADHD Daughter      Social History   Tobacco Use   Smoking status: Every Day    Current packs/day: 1.00    Average packs/day: 1 pack/day for 25.0 years (25.0 ttl pk-yrs)    Types: Cigarettes   Smokeless tobacco: Never  Vaping Use   Vaping status: Never Used  Substance Use Topics   Alcohol use: No   Drug use: No    Allergies as of 06/01/2023   (No Known Allergies)    Review of Systems:    All systems reviewed and negative except where noted in HPI.   Physical Exam:  BP 101/64   Pulse 78   Temp 98.1 F (36.7 C)   Ht 5\' 3"  (1.6 m)   Wt 261 lb 3.2 oz (118.5 kg)   BMI 46.27 kg/m  No LMP recorded. Patient has had a hysterectomy.  General:   Alert,  Well-developed, obese, pleasant and cooperative in NAD Lungs:  Respirations even and unlabored.  Clear throughout to auscultation.   No wheezes, crackles, or rhonchi. No acute distress. Heart:  Regular rate and rhythm; no murmurs, clicks, rubs, or gallops. Abdomen:  Normal bowel sounds.  No bruits.  Soft, and obese without masses, hepatosplenomegaly or hernias noted.  Mild epigastric tenderness.  Rest of abdomen is not Tender.  No guarding or rebound tenderness.    Neurologic:  Alert and oriented x3;  grossly normal neurologically. Psych:  Alert and cooperative. Anxious mood and affect.  Imaging Studies: No results found.  Assessment and Plan:   Dana Shaffer is a 48 y.o. y/o female has been referred for:  1.  Weight loss, likely due to low-carb diet.  Rule out GI pathology.  Scheduling Labs, Abd MRI, EGD & Colonoscopy for GI Evaluation.  2.  Epigastric pain  Scheduling EGD I discussed risks of  EGD with patient to include risk of bleeding, perforation, and risk of sedation.  Patient expressed understanding and agrees to proceed with EGD.   3.  Nausea: Suspect GERD or Gastritis.  Rexene Edison Pylori Breath Test  Labs: Celiac Panal, Food Allergy Panal, Alpha Gal  Patient Is reluctant to take PPI or H2RB which causes increased dizziness.  4.  Abnormal CT of liver (suspect fatty liver)  Scheduling MRI of Liver  5.  Diarrhea alternating with constipation; Likely IBS; Rule out Alpha Gal, Celiac and food allergies.  Labs: Celiac Panal, Food Allergy Panal, Alpha Gal  6.  Colon cancer screening  Scheduling Colonoscopy I discussed risks of colonoscopy with patient to include risk of bleeding, colon perforation, and risk of sedation.  Patient expressed understanding and agrees to proceed with colonoscopy.   Follow up After EGD and Colonoscopy Procedures.  Also f/u based on results.  Celso Amy, PA-C

## 2023-06-01 ENCOUNTER — Encounter: Payer: Self-pay | Admitting: Physician Assistant

## 2023-06-01 ENCOUNTER — Ambulatory Visit (INDEPENDENT_AMBULATORY_CARE_PROVIDER_SITE_OTHER): Payer: No Typology Code available for payment source | Admitting: Physician Assistant

## 2023-06-01 VITALS — BP 101/64 | HR 78 | Temp 98.1°F | Ht 63.0 in | Wt 261.2 lb

## 2023-06-01 DIAGNOSIS — R932 Abnormal findings on diagnostic imaging of liver and biliary tract: Secondary | ICD-10-CM

## 2023-06-01 DIAGNOSIS — R110 Nausea: Secondary | ICD-10-CM

## 2023-06-01 DIAGNOSIS — R1013 Epigastric pain: Secondary | ICD-10-CM

## 2023-06-01 DIAGNOSIS — R634 Abnormal weight loss: Secondary | ICD-10-CM | POA: Diagnosis not present

## 2023-06-01 DIAGNOSIS — Z1211 Encounter for screening for malignant neoplasm of colon: Secondary | ICD-10-CM

## 2023-06-01 DIAGNOSIS — R197 Diarrhea, unspecified: Secondary | ICD-10-CM

## 2023-06-01 DIAGNOSIS — K5900 Constipation, unspecified: Secondary | ICD-10-CM

## 2023-06-01 MED ORDER — NA SULFATE-K SULFATE-MG SULF 17.5-3.13-1.6 GM/177ML PO SOLN: 1.0000 | Freq: Once | ORAL | 0 refills | Status: AC

## 2023-06-01 NOTE — Patient Instructions (Signed)
MR Abdomen scheduled @ [facility]  06/09/23  @ 7:45  am arrival. Nothing to eat/drink after midnight

## 2023-06-06 ENCOUNTER — Encounter: Payer: Self-pay | Admitting: Physician Assistant

## 2023-06-08 ENCOUNTER — Encounter (INDEPENDENT_AMBULATORY_CARE_PROVIDER_SITE_OTHER): Payer: Self-pay | Admitting: Vascular Surgery

## 2023-06-08 ENCOUNTER — Ambulatory Visit (INDEPENDENT_AMBULATORY_CARE_PROVIDER_SITE_OTHER): Payer: No Typology Code available for payment source | Admitting: Vascular Surgery

## 2023-06-08 VITALS — BP 124/69 | HR 81 | Resp 16 | Ht 64.0 in | Wt 262.0 lb

## 2023-06-08 DIAGNOSIS — I700 Atherosclerosis of aorta: Secondary | ICD-10-CM

## 2023-06-08 DIAGNOSIS — R42 Dizziness and giddiness: Secondary | ICD-10-CM | POA: Diagnosis not present

## 2023-06-08 DIAGNOSIS — M79609 Pain in unspecified limb: Secondary | ICD-10-CM | POA: Insufficient documentation

## 2023-06-08 DIAGNOSIS — M79605 Pain in left leg: Secondary | ICD-10-CM

## 2023-06-08 DIAGNOSIS — R202 Paresthesia of skin: Secondary | ICD-10-CM

## 2023-06-08 DIAGNOSIS — F17200 Nicotine dependence, unspecified, uncomplicated: Secondary | ICD-10-CM | POA: Diagnosis not present

## 2023-06-08 DIAGNOSIS — M79604 Pain in right leg: Secondary | ICD-10-CM

## 2023-06-08 LAB — CELIAC DISEASE AB SCREEN W/RFX
Antigliadin Abs, IgA: 8 U (ref 0–19)
IgA/Immunoglobulin A, Serum: 185 mg/dL (ref 87–352)

## 2023-06-08 LAB — FOOD ALLERGY PROFILE

## 2023-06-08 LAB — ALPHA-GAL PANEL: IgE (Immunoglobulin E), Serum: 47 [IU]/mL (ref 6–495)

## 2023-06-08 LAB — H. PYLORI BREATH TEST

## 2023-06-08 NOTE — Assessment & Plan Note (Signed)
 Some of her symptoms sound consistent with Raynaud's phenomenon.  She also likely has some increased vasospasticity with her tobacco use.  She has fairly good radial and ulnar pulses and I do not think this is a large vessel vascular problem but she could have small vessel disease such as Buerger's disease as well that would be help with smoking cessation.

## 2023-06-08 NOTE — Assessment & Plan Note (Signed)
 Patient has undergone a carotid duplex at [facility] which I have reviewed.  This showed no significant carotid artery disease on either side.  Both subclavian arteries were multiphasic with normal flow.  Both vertebral arteries were antegrade with normal flow.  With this normal duplex, I do not think there is a large vessel vascular cause of her dizziness at this point.

## 2023-06-08 NOTE — Assessment & Plan Note (Signed)
 Unclear etiology.  With aortic atherosclerosis seen even in the small amount, ABIs will be done in the near future at her convenience.

## 2023-06-08 NOTE — Assessment & Plan Note (Signed)
 We discussed the deleterious nature of tobacco on the vascular system and strongly recommended smoking cessation.  She voices her understanding and understands that smoking cessation would be helpful for her vascular health long-term.

## 2023-06-08 NOTE — Addendum Note (Signed)
 Addended by: Annice Needy on: 06/08/2023 04:10 PM   Modules accepted: Orders

## 2023-06-08 NOTE — Progress Notes (Signed)
 Patient ID: Dana Shaffer, female   DOB: 1975/12/25, 48 y.o.   MRN: [id number]  Chief Complaint  Patient presents with   New Patient (Initial Visit)    Ref Malvin Johns consult vascular cause of symptoms cold sensation/numbness/tingling    HPI Dana Shaffer is a 48 y.o. female.  I am asked to see the patient by Dr. Malvin Johns for evaluation of vascular issues as a possible cause of some of her systemic symptoms.  The patient has a litany of complaints over the past year or 2.  On one hand, she has been exercising more and has lost over 60 pounds and is congratulated for this.  But during this time the patient says she is developing more numbness and tingling in her extremities, dizziness although that has improved some over the past couple of months, and she feels cold all over.  She has lots of splotchy mottling on her skin.  She describes what sounds like vasospasm in her hands and feet that can happen particularly with cold stimuli but not always with cold stimuli.  She does smoke and understands this is hard on the vascular system.  She describes positional dizziness with her head and has had a carotid duplex at [facility] which I have reviewed.  This showed no significant carotid artery disease on either side.  Both subclavian arteries were multiphasic with normal flow.  Both vertebral arteries were antegrade with normal flow.   The patient also describes significant pain in her legs.  She says this is worsened with activity.  She only has to walk short distances before having pain that starts in her thighs and radiates down into her calves.  Both legs are affected roughly equally.  No open wounds or infection.  No symptoms concerning for ischemic rest pain.  She has had a previous CT scan of the abdomen and pelvis which I have independently reviewed which does show a small amount of aortoiliac atherosclerotic plaque without obvious proximal stenosis. The patient also has a rheumatology consultation which  has been ordered and is pending.  She had a negative ANA by her primary care physician.    Past Medical History:  Diagnosis Date   Anemia    IRON INFUSION IN DEC 2018   Anxiety    Complication of anesthesia    AFTER HYSTERECTOMY, HAD TROUBLE BREATHING   Depression    GERD (gastroesophageal reflux disease)    RARE-NO MEDS   Hypothyroidism    Hypothyroidism    OSA (obstructive sleep apnea)    Seizures (HCC)    AS A CHILD X 5-6 TIMES   Sleep apnea     RECENTLY DX AND NO CPAP   Tachycardia    PTS PCP JUST TOOK PT OFF OF HER [facility] ON 05-11-17 THINKING THIS MAY BE CAUSING TACHYCARDIA    Past Surgical History:  Procedure Laterality Date   ABDOMINAL HYSTERECTOMY     CHOLECYSTECTOMY     COLONOSCOPY WITH ESOPHAGOGASTRODUODENOSCOPY (EGD)     LAPAROSCOPY N/A 05/21/2017   Procedure: Exploratory laporscropsy, lyses of adhesions, left cyst wall removal;  Surgeon: Ward, Elenora Fender, MD;  Location: [facility] ORS;  Service: Gynecology;  Laterality: N/A;  Lysis of Adhesions   THYROIDECTOMY     AGE 40     Family History  Problem Relation Age of Onset   ADD / ADHD Daughter       Social History   Tobacco Use   Smoking status: Every Day    Current packs/day:  1.00    Average packs/day: 1 pack/day for 25.0 years (25.0 ttl pk-yrs)    Types: Cigarettes   Smokeless tobacco: Never  Vaping Use   Vaping status: Never Used  Substance Use Topics   Alcohol use: No   Drug use: No     No Known Allergies  Current Outpatient Medications  Medication Sig Dispense Refill   Cyanocobalamin (B-12) 500 MCG SUBL Place under the tongue 3 (three) times a week.     levothyroxine (SYNTHROID) 125 MCG tablet Take 125 mcg by mouth daily before breakfast.     vitamin D3 (CHOLECALCIFEROL) 25 MCG tablet Take 1,000 Units by mouth 2 (two) times a week.     No current facility-administered medications for this visit.      REVIEW OF SYSTEMS (Negative unless checked)  Constitutional: [] Weight loss   [] Fever  [] Chills Cardiac: [] Chest pain   [] Chest pressure   [x] Palpitations   [] Shortness of breath when laying flat   [] Shortness of breath at rest   [] Shortness of breath with exertion. Vascular:  [] Pain in legs with walking   [] Pain in legs at rest   [] Pain in legs when laying flat   [] Claudication   [] Pain in feet when walking  [] Pain in feet at rest  [] Pain in feet when laying flat   [] History of DVT   [] Phlebitis   [] Swelling in legs   [] Varicose veins   [] Non-healing ulcers Pulmonary:   [] Uses home oxygen   [] Productive cough   [] Hemoptysis   [] Wheeze  [] COPD   [] Asthma Neurologic:  [x] Dizziness  [] Blackouts   [x] Seizures   [] History of stroke   [] History of TIA  [] Aphasia   [] Temporary blindness   [] Dysphagia   [] Weakness or numbness in arms   [] Weakness or numbness in legs Musculoskeletal:  [] Arthritis   [] Joint swelling   [] Joint pain   [] Low back pain Hematologic:  [] Easy bruising  [] Easy bleeding   [] Hypercoagulable state   [x] Anemic  [] Hepatitis Gastrointestinal:  [] Blood in stool   [] Vomiting blood  [] Gastroesophageal reflux/heartburn   [] Abdominal pain Genitourinary:  [] Chronic kidney disease   [] Difficult urination  [] Frequent urination  [] Burning with urination   [] Hematuria Skin:  [] Rashes   [] Ulcers   [] Wounds Psychological:  [x] History of anxiety   [x]  History of major depression.    Physical Exam BP 124/69   Pulse 81   Resp 16   Ht 5\' 4"  (1.626 m)   Wt 262 lb (118.8 kg)   BMI 44.97 kg/m  Gen:  WD/WN, NAD Head: [place]/AT, No temporalis wasting.  Ear/Nose/Throat: Hearing grossly intact, nares w/o erythema or drainage, oropharynx w/o Erythema/Exudate Eyes: Conjunctiva clear, sclera non-icteric  Neck: trachea midline.  No JVD.  Pulmonary:  Good air movement, respirations not labored, no use of accessory muscles  Cardiac: RRR, no JVD Vascular:  Vessel Right Left  Radial Palpable Palpable  Ulnar Palpable Palpable                      DP Palpable Palpable  PT  Palpable Palpable   Gastrointestinal:. No masses, surgical incisions, or scars. Musculoskeletal: M/S 5/5 throughout.  Extremities without ischemic changes.  No deformity or atrophy.  Neurologic: Sensation grossly intact in extremities.  Symmetrical.  Speech is fluent. Motor exam as listed above. Psychiatric: Judgment intact, Mood & affect appropriate for pt's clinical situation. Dermatologic: No rashes or ulcers noted.  No cellulitis or open wounds.    Radiology No results found.  Labs Recent Results (  from the past 2160 hours)  Lipase, blood     Status: None   Collection Time: 04/18/23  8:34 AM  Result Value Ref Range   Lipase 24 11 - 51 U/L    Comment: Performed at [facility], [address] [place], [place] [address]  Comprehensive metabolic panel     Status: Abnormal   Collection Time: 04/18/23  8:34 AM  Result Value Ref Range   Sodium 136 135 - 145 mmol/L   Potassium 4.0 3.5 - 5.1 mmol/L   Chloride 102 98 - 111 mmol/L   CO2 23 22 - 32 mmol/L   Glucose, Bld 104 (H) 70 - 99 mg/dL    Comment: Glucose reference range applies only to samples taken after fasting for at least 8 hours.   BUN 21 (H) 6 - 20 mg/dL   Creatinine, Ser 6.04 0.44 - 1.00 mg/dL   Calcium 9.2 8.9 - 54.0 mg/dL   Total Protein 7.4 6.5 - 8.1 g/dL   Albumin 4.3 3.5 - 5.0 g/dL   AST 13 (L) 15 - 41 U/L   ALT 16 0 - 44 U/L   Alkaline Phosphatase 67 38 - 126 U/L   Total Bilirubin 0.6 0.0 - 1.2 mg/dL   GFR, Estimated >98 >11 mL/min    Comment: (NOTE) Calculated using the CKD-EPI Creatinine Equation (2021)    Anion gap 11 5 - 15    Comment: Performed at [facility], [address] [place], [place] [address]  CBC     Status: Abnormal   Collection Time: 04/18/23  8:34 AM  Result Value Ref Range   WBC 8.0 4.0 - 10.5 K/uL   RBC 5.17 (H) 3.87 - 5.11 MIL/uL   Hemoglobin 14.5 12.0 - 15.0 g/dL   HCT 29.5 62.1 - 30.8 %   MCV 84.9 80.0 - 100.0 fL   MCH 28.0 26.0 - 34.0 pg   MCHC 33.0 30.0 -  36.0 g/dL   RDW 65.7 84.6 - 96.2 %   Platelets 309 150 - 400 K/uL   nRBC 0.0 0.0 - 0.2 %    Comment: Performed at [facility], [address] [place], [place] [address]  Urinalysis, Routine w reflex microscopic -Urine, Random     Status: Abnormal   Collection Time: 04/18/23  8:34 AM  Result Value Ref Range   Color, Urine YELLOW (A) YELLOW   APPearance CLEAR (A) CLEAR   Specific Gravity, Urine 1.015 1.005 - 1.030   pH 6.0 5.0 - 8.0   Glucose, UA NEGATIVE NEGATIVE mg/dL   Hgb urine dipstick NEGATIVE NEGATIVE   Bilirubin Urine NEGATIVE NEGATIVE   Ketones, ur NEGATIVE NEGATIVE mg/dL   Protein, ur NEGATIVE NEGATIVE mg/dL   Nitrite NEGATIVE NEGATIVE   Leukocytes,Ua NEGATIVE NEGATIVE    Comment: Performed at [facility], [address] [place], [place] [address]  H. pylori breath test     Status: None   Collection Time: 06/04/23  9:27 AM  Result Value Ref Range   H pylori Breath Test Negative Negative  Alpha-Gal Panel     Status: None (Preliminary result)   Collection Time: 06/04/23  9:27 AM  Result Value Ref Range   Class Description Allergens Comment     Comment:     Levels of Specific IgE       Class  Description of Class     ---------------------------  -----  --------------------                    <  0.10         0         Negative            0.10 -    0.31         0/I       Equivocal/Low            0.32 -    0.55         I         Low            0.56 -    1.40         II        Moderate            1.41 -    3.90         III       High            3.91 -   19.00         IV        Very High           19.01 -  100.00         V         Very High                   >100.00         VI        Very High    IgE (Immunoglobulin E), Serum WILL FOLLOW    Pork IgE WILL FOLLOW    Beef IgE WILL FOLLOW    Allergen Lamb IgE WILL FOLLOW    O215-IgE Alpha-Gal WILL FOLLOW   Celiac Disease Ab Screen w/Rfx     Status: None   Collection Time: 06/04/23  9:27 AM  Result  Value Ref Range   Antigliadin Abs, IgA 8 0 - 19 units    Comment:                    Negative                   0 - 19                    Weak Positive             20 - 30                    Moderate to Strong Positive   >30    Transglutaminase IgA <2 0 - 3 U/mL    Comment:                               Negative        0 -  3                               Weak Positive   4 - 10                               Positive           >10  Tissue Transglutaminase (tTG) has been identified  as the endomysial antigen.  Studies have demonstr-  ated that endomysial IgA antibodies have over 99%  specificity for gluten sensitive enteropathy.    IgA/Immunoglobulin A, Serum 185 87 - 352 mg/dL  Food Allergy Profile     Status: None (Preliminary result)   Collection Time: 06/04/23  9:27 AM  Result Value Ref Range   Egg White IgE WILL FOLLOW    Peanut IgE WILL FOLLOW    Soybean IgE WILL FOLLOW    Milk IgE WILL FOLLOW    Clam IgE WILL FOLLOW    Shrimp IgE WILL FOLLOW    Walnut IgE WILL FOLLOW    Codfish IgE WILL FOLLOW    Scallop IgE WILL FOLLOW    Wheat IgE WILL FOLLOW    Allergen Corn, IgE WILL FOLLOW    Sesame Seed IgE WILL FOLLOW     Assessment/Plan:  Dizzinesses Patient has undergone a carotid duplex at [facility] which I have reviewed.  This showed no significant carotid artery disease on either side.  Both subclavian arteries were multiphasic with normal flow.  Both vertebral arteries were antegrade with normal flow.  With this normal duplex, I do not think there is a large vessel vascular cause of her dizziness at this point.  Aortic atherosclerosis (HCC) The patient has undergone a CT scan of the abdomen and pelvis earlier this year which I have independently reviewed.  This does show a small amount of aortoiliac atherosclerosis without any obvious stenosis identified.  Given her young age and history of tobacco use, this is something that could be cause problems in the future and we have not  done any assessment of her lower extremity arterial perfusion.  With her leg complaints, we will check ABIs in the near future at her convenience.  Tobacco use disorder We discussed the deleterious nature of tobacco on the vascular system and strongly recommended smoking cessation.  She voices her understanding and understands that smoking cessation would be helpful for her vascular health long-term.  Tingling Some of her symptoms sound consistent with Raynaud's phenomenon.  She also likely has some increased vasospasticity with her tobacco use.  She has fairly good radial and ulnar pulses and I do not think this is a large vessel vascular problem but she could have small vessel disease such as Buerger's disease as well that would be help with smoking cessation.  Pain in limb Unclear etiology.  With aortic atherosclerosis seen even in the small amount, ABIs will be done in the near future at her convenience.      Festus Barren 06/08/2023, 11:35 AM   This note was created with Dragon medical transcription system.  Any errors from dictation are unintentional.

## 2023-06-08 NOTE — Assessment & Plan Note (Signed)
 The patient has undergone a CT scan of the abdomen and pelvis earlier this year which I have independently reviewed.  This does show a small amount of aortoiliac atherosclerosis without any obvious stenosis identified.  Given her young age and history of tobacco use, this is something that could be cause problems in the future and we have not done any assessment of her lower extremity arterial perfusion.  With her leg complaints, we will check ABIs in the near future at her convenience.

## 2023-06-09 ENCOUNTER — Ambulatory Visit
Admission: RE | Admit: 2023-06-09 | Discharge: 2023-06-09 | Disposition: A | Discharge status: Home / Self Care | Payer: No Typology Code available for payment source | Source: Ambulatory Visit | Attending: Physician Assistant | Admitting: Physician Assistant

## 2023-06-09 ENCOUNTER — Encounter (INDEPENDENT_AMBULATORY_CARE_PROVIDER_SITE_OTHER): Payer: Self-pay | Admitting: Vascular Surgery

## 2023-06-09 DIAGNOSIS — R932 Abnormal findings on diagnostic imaging of liver and biliary tract: Secondary | ICD-10-CM | POA: Diagnosis present

## 2023-06-09 MED ORDER — GADOBUTROL 1 MMOL/ML IV SOLN
10.0000 mL | Freq: Once | INTRAVENOUS | Status: AC | PRN
  Administered 2023-06-09: 10 mL via INTRAVENOUS

## 2023-06-10 ENCOUNTER — Encounter: Payer: Self-pay | Admitting: Physician Assistant

## 2023-06-30 ENCOUNTER — Ambulatory Visit: Payer: No Typology Code available for payment source | Admitting: Physician Assistant

## 2023-07-01 ENCOUNTER — Ambulatory Visit: Admitting: General Practice

## 2023-07-01 ENCOUNTER — Encounter: Payer: Self-pay | Admitting: Gastroenterology

## 2023-07-01 ENCOUNTER — Encounter
Admission: RE | Disposition: A | Discharge status: Home / Self Care | Payer: Self-pay | Source: Home / Self Care | Attending: Gastroenterology

## 2023-07-01 ENCOUNTER — Other Ambulatory Visit: Payer: Self-pay

## 2023-07-01 ENCOUNTER — Ambulatory Visit
Admission: RE | Admit: 2023-07-01 | Discharge: 2023-07-01 | Disposition: A | Discharge status: Home / Self Care | Payer: No Typology Code available for payment source | Attending: Gastroenterology | Admitting: Gastroenterology

## 2023-07-01 DIAGNOSIS — E039 Hypothyroidism, unspecified: Secondary | ICD-10-CM | POA: Diagnosis not present

## 2023-07-01 DIAGNOSIS — K5730 Diverticulosis of large intestine without perforation or abscess without bleeding: Secondary | ICD-10-CM

## 2023-07-01 DIAGNOSIS — F32A Depression, unspecified: Secondary | ICD-10-CM | POA: Diagnosis not present

## 2023-07-01 DIAGNOSIS — K2970 Gastritis, unspecified, without bleeding: Secondary | ICD-10-CM | POA: Diagnosis not present

## 2023-07-01 DIAGNOSIS — K2960 Other gastritis without bleeding: Secondary | ICD-10-CM | POA: Diagnosis not present

## 2023-07-01 DIAGNOSIS — Z79890 Hormone replacement therapy (postmenopausal): Secondary | ICD-10-CM | POA: Insufficient documentation

## 2023-07-01 DIAGNOSIS — F419 Anxiety disorder, unspecified: Secondary | ICD-10-CM | POA: Insufficient documentation

## 2023-07-01 DIAGNOSIS — E119 Type 2 diabetes mellitus without complications: Secondary | ICD-10-CM | POA: Insufficient documentation

## 2023-07-01 DIAGNOSIS — G4733 Obstructive sleep apnea (adult) (pediatric): Secondary | ICD-10-CM | POA: Insufficient documentation

## 2023-07-01 DIAGNOSIS — K31A19 Gastric intestinal metaplasia without dysplasia, unspecified site: Secondary | ICD-10-CM

## 2023-07-01 DIAGNOSIS — K2289 Other specified disease of esophagus: Secondary | ICD-10-CM

## 2023-07-01 DIAGNOSIS — F17210 Nicotine dependence, cigarettes, uncomplicated: Secondary | ICD-10-CM | POA: Diagnosis not present

## 2023-07-01 DIAGNOSIS — R1013 Epigastric pain: Secondary | ICD-10-CM

## 2023-07-01 DIAGNOSIS — Z1211 Encounter for screening for malignant neoplasm of colon: Secondary | ICD-10-CM

## 2023-07-01 HISTORY — PX: ESOPHAGOGASTRODUODENOSCOPY (EGD) WITH PROPOFOL: SHX5813

## 2023-07-01 HISTORY — PX: COLONOSCOPY WITH PROPOFOL: SHX5780

## 2023-07-01 SURGERY — COLONOSCOPY WITH PROPOFOL
Anesthesia: General

## 2023-07-01 MED ORDER — LIDOCAINE HCL (PF) 2 % IJ SOLN
INTRAMUSCULAR | Status: AC
  Filled 2023-07-01: qty 5

## 2023-07-01 MED ORDER — PROPOFOL 1000 MG/100ML IV EMUL
INTRAVENOUS | Status: AC
  Filled 2023-07-01: qty 100

## 2023-07-01 MED ORDER — SODIUM CHLORIDE 0.9 % IV SOLN: INTRAVENOUS | Status: DC

## 2023-07-01 MED ORDER — PROPOFOL 10 MG/ML IV BOLUS
INTRAVENOUS | Status: DC | PRN
  Administered 2023-07-01 (×6): 50 mg via INTRAVENOUS

## 2023-07-01 MED ORDER — LIDOCAINE HCL (PF) 2 % IJ SOLN
INTRAMUSCULAR | Status: DC | PRN
  Administered 2023-07-01: 60 mg via INTRADERMAL

## 2023-07-01 MED ORDER — LACTATED RINGERS IV SOLN: INTRAVENOUS | Status: DC | PRN

## 2023-07-01 MED ORDER — STERILE WATER FOR IRRIGATION IR SOLN
Status: DC | PRN
  Administered 2023-07-01: 60 mL

## 2023-07-01 NOTE — Anesthesia Preprocedure Evaluation (Signed)
 Anesthesia Evaluation  Patient identified by MRN, date of birth, ID band Patient awake    Reviewed: Allergy & Precautions, NPO status , Patient's Chart, lab work & pertinent test results  History of Anesthesia Complications (+) PONV and history of anesthetic complications (difficulty breathing after hysterectomy)  Airway Mallampati: II  TM Distance: >3 FB Neck ROM: Full    Dental  (+) Chipped, Dental Advidsory Given   Pulmonary sleep apnea (recently diagnosed, does not have CPAP) , neg COPD, Current Smoker and Patient abstained from smoking.   breath sounds clear to auscultation- rhonchi (-) wheezing      Cardiovascular Exercise Tolerance: Good (-) hypertension(-) CAD, (-) Past MI, (-) Cardiac Stents and (-) CABG  Rhythm:Regular Rate:Normal - Systolic murmurs and - Diastolic murmurs    Neuro/Psych  Headaches, Seizures: during childhood only.  PSYCHIATRIC DISORDERS Anxiety Depression     Neuromuscular disease    GI/Hepatic Neg liver ROS,GERD  ,,  Endo/Other  neg diabetesHypothyroidism  Class 3 obesity  Renal/GU      Musculoskeletal   Abdominal   Peds  Hematology  (+) Blood dyscrasia, anemia   Anesthesia Other Findings Past Medical History: No date: Anemia     Comment:  IRON INFUSION IN DEC 2018 No date: Anxiety No date: Complication of anesthesia     Comment:  AFTER HYSTERECTOMY, HAD TROUBLE BREATHING No date: GERD (gastroesophageal reflux disease)     Comment:  RARE-NO MEDS No date: Hypothyroidism No date: Seizures (HCC)     Comment:  AS A CHILD X 5-6 TIMES No date: Sleep apnea     Comment:   RECENTLY DX AND NO CPAP No date: Tachycardia     Comment:  PTS PCP JUST TOOK PT OFF OF HER [facility]INE ON 05-11-17              THINKING THIS MAY BE CAUSING TACHYCARDIA   Reproductive/Obstetrics                             Anesthesia Physical Anesthesia Plan  ASA: 3  Anesthesia Plan:  General   Post-op Pain Management: Minimal or no pain anticipated   Induction: Intravenous  PONV Risk Score and Plan: 3 and Propofol infusion, TIVA and Ondansetron  Airway Management Planned: Nasal Cannula  Additional Equipment: None  Intra-op Plan:   Post-operative Plan:   Informed Consent: I have reviewed the patients History and Physical, chart, labs and discussed the procedure including the risks, benefits and alternatives for the proposed anesthesia with the patient or authorized representative who has indicated his/her understanding and acceptance.     Dental advisory given  Plan Discussed with: CRNA and Surgeon  Anesthesia Plan Comments: (Discussed risks of anesthesia with patient, including possibility of difficulty with spontaneous ventilation under anesthesia necessitating airway intervention, PONV, and rare risks such as cardiac or respiratory or neurological events, and allergic reactions. Discussed the role of CRNA in patient's perioperative care. Patient understands.)       Anesthesia Quick Evaluation

## 2023-07-01 NOTE — Transfer of Care (Signed)
 Immediate Anesthesia Transfer of Care Note  Patient: Dana Shaffer  Procedure(s) Performed: COLONOSCOPY WITH PROPOFOL ESOPHAGOGASTRODUODENOSCOPY (EGD) WITH PROPOFOL  Patient Location: PACU and Endoscopy Unit  Anesthesia Type:MAC  Level of Consciousness: awake and drowsy  Airway & Oxygen Therapy: Patient Spontanous Breathing and Patient connected to nasal cannula oxygen  Post-op Assessment: Report given to RN and Post -op Vital signs reviewed and stable  Post vital signs: Reviewed and stable  Last Vitals:  Vitals Value Taken Time  BP 125/105   Temp    Pulse 77 07/01/23 0822  Resp 23 07/01/23 0822  SpO2 99 % 07/01/23 0822  Vitals shown include unfiled device data.  Last Pain:  Vitals:   07/01/23 0720  TempSrc: Temporal  PainSc: 0-No pain         Complications: No notable events documented.

## 2023-07-01 NOTE — Op Note (Signed)
 [facility] Gastroenterology Patient Name: Dana Shaffer Procedure Date: 07/01/2023 7:11 AM MRN: [id number] Account #: [id number] Date of Birth: 06-20-1975 Admit Type: Outpatient Age: 48 Room: [facility] ENDO ROOM 3 Gender: Female Note Status: Finalized Instrument Name: Upper Endoscope [id number] Procedure:             Upper GI endoscopy Indications:           Abdominal pain Providers:             Wyline Mood MD, MD Referring MD:          Jeris Penta. Richarda Blade (Referring MD) Medicines:             Monitored Anesthesia Care Complications:         No immediate complications. Procedure:             Pre-Anesthesia Assessment:                        - Prior to the procedure, a History and Physical was                         performed, and patient medications, allergies and                         sensitivities were reviewed. The patient's tolerance                         of previous anesthesia was reviewed.                        - The risks and benefits of the procedure and the                         sedation options and risks were discussed with the                         patient. All questions were answered and informed                         consent was obtained.                        - ASA Grade Assessment: II - A patient with mild                         systemic disease.                        After obtaining informed consent, the endoscope was                         passed under direct vision. Throughout the procedure,                         the patient's blood pressure, pulse, and oxygen                         saturations were monitored continuously. The Endoscope                         was introduced  through the mouth, and advanced to the                         third part of duodenum. The upper GI endoscopy was                         accomplished with ease. The patient tolerated the                         procedure well. Findings:      Diffuse moderate  mucosal changes characterized by longitudinal markings,       altered texture and a decreased vascular pattern were found in the       entire esophagus. Biopsies were taken with a cold forceps for histology.      Diffuse moderate inflammation characterized by congestion (edema),       erosions and erythema was found on the greater curvature of the stomach.       Biopsies were taken with a cold forceps for histology.      The cardia and gastric fundus were normal on retroflexion.      The examined duodenum was normal. Impression:            - Longitudinally marked, texture changed, decreased                         vascular pattern mucosa in the esophagus. Biopsied.                        - Gastritis. Biopsied.                        - Normal examined duodenum. Recommendation:        - Await pathology results.                        - Perform a colonoscopy today. Procedure Code(s):     --- Professional ---                        [phone], Esophagogastroduodenoscopy, flexible,                         transoral; with biopsy, single or multiple Diagnosis Code(s):     --- Professional ---                        K22.89, Other specified disease of esophagus                        K29.70, Gastritis, unspecified, without bleeding                        R10.9, Unspecified abdominal pain CPT copyright 2022 American Medical Association. All rights reserved. The codes documented in this report are preliminary and upon coder review may  be revised to meet current compliance requirements. Wyline Mood, MD Wyline Mood MD, MD 07/01/2023 8:04:17 AM This report has been signed electronically. Number of Addenda: 0 Note Initiated On: 07/01/2023 7:11 AM Estimated Blood Loss:  Estimated blood loss: none.      [facility]

## 2023-07-01 NOTE — Anesthesia Postprocedure Evaluation (Signed)
 Anesthesia Post Note  Patient: Dana Shaffer  Procedure(s) Performed: COLONOSCOPY WITH PROPOFOL ESOPHAGOGASTRODUODENOSCOPY (EGD) WITH PROPOFOL  Patient location during evaluation: Endoscopy Anesthesia Type: General Level of consciousness: awake and alert Pain management: pain level controlled Vital Signs Assessment: post-procedure vital signs reviewed and stable Respiratory status: spontaneous breathing, nonlabored ventilation, respiratory function stable and patient connected to nasal cannula oxygen Cardiovascular status: blood pressure returned to baseline and stable Postop Assessment: no apparent nausea or vomiting Anesthetic complications: no  No notable events documented.   Last Vitals:  Vitals:   07/01/23 0825 07/01/23 0831  BP: (!) 103/51 113/66  Pulse: 79 81  Resp: (!) 24 14  Temp: (!) 36.4 C   SpO2: 100% 100%    Last Pain:  Vitals:   07/01/23 0831  TempSrc:   PainSc: 0-No pain                 Stephanie Coup

## 2023-07-01 NOTE — H&P (Signed)
 Wyline Mood, MD [address], [place], [place], [address] [address], [address], [place], [place], [address] Phone: [phone]  Fax: [phone]  Primary Care Physician:  Abram Sander, MD   Pre-Procedure History & Physical: HPI:  Dana Shaffer is a 48 y.o. female is here for an endoscopy and colonoscopy    Past Medical History:  Diagnosis Date   Anemia    IRON INFUSION IN DEC 2018   Anxiety    Complication of anesthesia    AFTER HYSTERECTOMY, HAD TROUBLE BREATHING   Depression    GERD (gastroesophageal reflux disease)    RARE-NO MEDS   Hypothyroidism    Hypothyroidism    OSA (obstructive sleep apnea)    Seizures (HCC)    AS A CHILD X 5-6 TIMES   Sleep apnea     RECENTLY DX AND NO CPAP   Tachycardia    PTS PCP JUST TOOK PT OFF OF HER [facility] ON 05-11-17 THINKING THIS MAY BE CAUSING TACHYCARDIA    Past Surgical History:  Procedure Laterality Date   ABDOMINAL HYSTERECTOMY     CHOLECYSTECTOMY     COLONOSCOPY WITH ESOPHAGOGASTRODUODENOSCOPY (EGD)     LAPAROSCOPY N/A 05/21/2017   Procedure: Exploratory laporscropsy, lyses of adhesions, left cyst wall removal;  Surgeon: Ward, Elenora Fender, MD;  Location: [facility] ORS;  Service: Gynecology;  Laterality: N/A;  Lysis of Adhesions   THYROIDECTOMY     AGE 44    Prior to Admission medications   Medication Sig Start Date End Date Taking? Authorizing Provider  Cyanocobalamin (B-12) 500 MCG SUBL Place under the tongue 3 (three) times a week.   Yes [provider]  levothyroxine (SYNTHROID) 125 MCG tablet Take 125 mcg by mouth daily before breakfast. 11/23/18  Yes [provider]  vitamin D3 (CHOLECALCIFEROL) 25 MCG tablet Take 1,000 Units by mouth 2 (two) times a week.   Yes [provider]    Allergies as of 06/01/2023   (No Known Allergies)    Family History  Problem Relation Age of Onset   ADD / ADHD Daughter     Social History   Socioeconomic History   Marital status:  Single    Spouse name: Not on file   Number of children: 1   Years of education: Not on file   Highest education level: GED or equivalent  Occupational History   Not on file  Tobacco Use   Smoking status: Every Day    Current packs/day: 1.00    Average packs/day: 1 pack/day for 25.0 years (25.0 ttl pk-yrs)    Types: Cigarettes   Smokeless tobacco: Never  Vaping Use   Vaping status: Never Used  Substance and Sexual Activity   Alcohol use: No   Drug use: No   Sexual activity: Not on file  Other Topics Concern   Not on file  Social History Narrative   Not on file   Social Drivers of Health   Financial Resource Strain: Low Risk  (02/06/2019)   Overall Financial Resource Strain (CARDIA)    Difficulty of Paying Living Expenses: Not hard at all  Food Insecurity: No Food Insecurity (02/06/2019)   Hunger Vital Sign    Worried About Running Out of Food in the Last Year: Never true    Ran Out of Food in the Last Year: Never true  Transportation Needs: No Transportation Needs (02/06/2019)   PRAPARE - Administrator, Civil Service (Medical): No  Lack of Transportation (Non-Medical): No  Physical Activity: Unknown (02/06/2019)   Exercise Vital Sign    Days of Exercise per Week: 2 days    Minutes of Exercise per Session: Not on file  Stress: No Stress Concern Present (02/06/2019)   [employer] of Occupational Health - Occupational Stress Questionnaire    Feeling of Stress : Only a little  Social Connections: Unknown (02/06/2019)   Social Connection and Isolation Panel [NHANES]    Frequency of Communication with Friends and Family: Not on file    Frequency of Social Gatherings with Friends and Family: Not on file    Attends Religious Services: Never    Active Member of Clubs or Organizations: No    Attends Banker Meetings: Never    Marital Status: Not on file  Intimate Partner Violence: Not At Risk (02/06/2019)   Humiliation, Afraid, Rape, and  Kick questionnaire    Fear of Current or Ex-Partner: No    Emotionally Abused: No    Physically Abused: No    Sexually Abused: No    Review of Systems: See HPI, otherwise negative ROS  Physical Exam: BP (!) 161/73   Pulse 87   Temp 98.1 F (36.7 C) (Temporal)   Resp 18   Ht 5\' 4"  (1.626 m)   Wt 116.4 kg   SpO2 94%   BMI 44.05 kg/m  General:   Alert,  pleasant and cooperative in NAD Head:  Normocephalic and atraumatic. Neck:  Supple; no masses or thyromegaly. Lungs:  Clear throughout to auscultation, normal respiratory effort.    Heart:  +S1, +S2, Regular rate and rhythm, No edema. Abdomen:  Soft, nontender and nondistended. Normal bowel sounds, without guarding, and without rebound.   Neurologic:  Alert and  oriented x4;  grossly normal neurologically.  Impression/Plan: Dana Shaffer is here for an endoscopy and colonoscopy  to be performed for  evaluation of abdominal pain and colon cancer screening     Risks, benefits, limitations, and alternatives regarding endoscopy have been reviewed with the patient.  Questions have been answered.  All parties agreeable.   Wyline Mood, MD  07/01/2023, 7:50 AM abdomin

## 2023-07-01 NOTE — Op Note (Signed)
 [facility] Gastroenterology Patient Name: Dana Shaffer Procedure Date: 07/01/2023 7:10 AM MRN: [id number] Account #: [id number] Date of Birth: 12-Feb-1976 Admit Type: Outpatient Age: 48 Room: [facility] ENDO ROOM 3 Gender: Female Note Status: Finalized Instrument Name: Prentice Docker [id number] Procedure:             Colonoscopy Indications:           Screening for colorectal malignant neoplasm Providers:             Wyline Mood MD, MD Referring MD:          Jeris Penta. Richarda Blade (Referring MD) Medicines:             Monitored Anesthesia Care Complications:         No immediate complications. Procedure:             Pre-Anesthesia Assessment:                        - Prior to the procedure, a History and Physical was                         performed, and patient medications, allergies and                         sensitivities were reviewed. The patient's tolerance                         of previous anesthesia was reviewed.                        - The risks and benefits of the procedure and the                         sedation options and risks were discussed with the                         patient. All questions were answered and informed                         consent was obtained.                        - ASA Grade Assessment: II - A patient with mild                         systemic disease.                        After obtaining informed consent, the colonoscope was                         passed under direct vision. Throughout the procedure,                         the patient's blood pressure, pulse, and oxygen                         saturations were monitored continuously. The                         Colonoscope was introduced  through the anus and                         advanced to the the cecum, identified by the                         appendiceal orifice. The colonoscopy was performed                         with ease. The patient tolerated the procedure well.                          The quality of the bowel preparation was good. The                         ileocecal valve, appendiceal orifice, and rectum were                         photographed. Findings:      The perianal and digital rectal examinations were normal.      Multiple medium-mouthed diverticula were found in the sigmoid colon.      The exam was otherwise without abnormality on direct and retroflexion       views. Impression:            - Diverticulosis in the sigmoid colon.                        - The examination was otherwise normal on direct and                         retroflexion views.                        - No specimens collected. Recommendation:        - Discharge patient to home (with escort).                        - Resume previous diet.                        - Continue present medications.                        - Repeat colonoscopy in 10 years for screening                         purposes. Procedure Code(s):     --- Professional ---                        [phone], Colonoscopy, flexible; diagnostic, including                         collection of specimen(s) by brushing or washing, when                         performed (separate procedure) Diagnosis Code(s):     --- Professional ---                        Z12.11, Encounter for screening for malignant  neoplasm                         of colon                        K57.30, Diverticulosis of large intestine without                         perforation or abscess without bleeding CPT copyright 2022 American Medical Association. All rights reserved. The codes documented in this report are preliminary and upon coder review may  be revised to meet current compliance requirements. Wyline Mood, MD Wyline Mood MD, MD 07/01/2023 8:19:50 AM This report has been signed electronically. Number of Addenda: 0 Note Initiated On: 07/01/2023 7:10 AM Scope Withdrawal Time: 0 hours 8 minutes 55 seconds  Total Procedure Duration: 0 hours  11 minutes 56 seconds  Estimated Blood Loss:  Estimated blood loss: none.      [facility]

## 2023-07-02 LAB — SURGICAL PATHOLOGY

## 2023-07-20 ENCOUNTER — Telehealth: Payer: Self-pay

## 2023-07-20 MED ORDER — OMEPRAZOLE 40 MG PO CPDR: 40.0000 mg | DELAYED_RELEASE_CAPSULE | Freq: Every day | ORAL | 1 refills | Status: AC

## 2023-07-20 NOTE — Telephone Encounter (Signed)
-----   Message from Wyline Mood sent at 07/05/2023 10:03 PM EDT ----- Inform bx show features of acid reflux- ensure on PPI , bx also show intestinal metaplasia- repeat EGD in 6 months for gastric mapping . Check h pylor breath test

## 2023-07-20 NOTE — Telephone Encounter (Signed)
 Patient was contacted and informed her the below information. Patient stated that she had not been given a PPI to take for acid reflux. I let her know that Dr. Tobi Bastos would like for her to start taking. Patient stated that she would take but if it causes for her to have dizziness, then she will stop taking. I let her know that if she did develop any symptoms, to please notify us so I could let Dr. Tobi Bastos know. Patient understood.  Patient was also informed that I would be placing her in our 6 month recall list to remind her that Dr. Tobi Bastos wanted her to repeat her EGD for gastric mapping. Patient agreed. Patient stated that she had a H Pylori test done in February and that it was negative, therefore, she will not repeat.

## 2023-08-04 ENCOUNTER — Encounter: Payer: Self-pay | Admitting: Gastroenterology

## 2023-08-06 ENCOUNTER — Ambulatory Visit (INDEPENDENT_AMBULATORY_CARE_PROVIDER_SITE_OTHER): Payer: No Typology Code available for payment source | Admitting: Vascular Surgery

## 2023-08-06 ENCOUNTER — Ambulatory Visit (INDEPENDENT_AMBULATORY_CARE_PROVIDER_SITE_OTHER): Payer: No Typology Code available for payment source

## 2023-08-06 ENCOUNTER — Encounter (INDEPENDENT_AMBULATORY_CARE_PROVIDER_SITE_OTHER): Payer: Self-pay | Admitting: Vascular Surgery

## 2023-08-06 VITALS — BP 111/74 | HR 74 | Resp 17 | Ht 64.0 in | Wt 254.4 lb

## 2023-08-06 DIAGNOSIS — M79604 Pain in right leg: Secondary | ICD-10-CM | POA: Diagnosis not present

## 2023-08-06 DIAGNOSIS — M79605 Pain in left leg: Secondary | ICD-10-CM | POA: Diagnosis not present

## 2023-08-06 DIAGNOSIS — I700 Atherosclerosis of aorta: Secondary | ICD-10-CM

## 2023-08-06 DIAGNOSIS — R42 Dizziness and giddiness: Secondary | ICD-10-CM

## 2023-08-06 NOTE — Progress Notes (Signed)
 MRN : [id number]  Dana Shaffer is a 48 y.o. (09-Nov-1975) female who presents with chief complaint of  Chief Complaint  Patient presents with   Venous Insufficiency  .  History of Present Illness: Patient returns today in follow up of leg pain and dizziness.  She still has a lot of somatic complaints and complains of a lot of dizziness.  She says her blood pressure has been running low lately.  No new ulcerations or infections of the lower extremities.  ABIs today are normal at 1.25 on the right and 1.31 on the left with normal triphasic waveforms and normal digital pressures bilaterally.  Current Outpatient Medications  Medication Sig Dispense Refill   Cyanocobalamin (B-12) 500 MCG SUBL Place under the tongue 3 (three) times a week.     levothyroxine (SYNTHROID) 125 MCG tablet Take 125 mcg by mouth daily before breakfast.     omeprazole  (PRILOSEC) 40 MG capsule Take 1 capsule (40 mg total) by mouth daily. 90 capsule 1   REPATHA SURECLICK 140 MG/ML SOAJ Inject into the skin.     vitamin D3 (CHOLECALCIFEROL) 25 MCG tablet Take 1,000 Units by mouth 2 (two) times a week.     No current facility-administered medications for this visit.    Past Medical History:  Diagnosis Date   Anemia    IRON INFUSION IN DEC 2018   Anxiety    Complication of anesthesia    AFTER HYSTERECTOMY, HAD TROUBLE BREATHING   Depression    GERD (gastroesophageal reflux disease)    RARE-NO MEDS   Hypothyroidism    Hypothyroidism    OSA (obstructive sleep apnea)    Seizures (HCC)    AS A CHILD X 5-6 TIMES   Sleep apnea     RECENTLY DX AND NO CPAP   Tachycardia    PTS PCP JUST TOOK PT OFF OF HER [facility] ON 05-11-17 THINKING THIS MAY BE CAUSING TACHYCARDIA    Past Surgical History:  Procedure Laterality Date   ABDOMINAL HYSTERECTOMY     CHOLECYSTECTOMY     COLONOSCOPY WITH ESOPHAGOGASTRODUODENOSCOPY (EGD)     COLONOSCOPY WITH PROPOFOL  N/A 07/01/2023   Procedure: COLONOSCOPY WITH PROPOFOL ;   Surgeon: Luke Salaam, MD;  Location: [facility]COPY;  Service: Gastroenterology;  Laterality: N/A;   ESOPHAGOGASTRODUODENOSCOPY (EGD) WITH PROPOFOL  N/A 07/01/2023   Procedure: ESOPHAGOGASTRODUODENOSCOPY (EGD) WITH PROPOFOL ;  Surgeon: Luke Salaam, MD;  Location: [facility] ENDOSCOPY;  Service: Gastroenterology;  Laterality: N/A;   LAPAROSCOPY N/A 05/21/2017   Procedure: Exploratory laporscropsy, lyses of adhesions, left cyst wall removal;  Surgeon: Ward, Margarie Shay, MD;  Location: [facility] ORS;  Service: Gynecology;  Laterality: N/A;  Lysis of Adhesions   THYROIDECTOMY     AGE 51     Social History   Tobacco Use   Smoking status: Every Day    Current packs/day: 1.00    Average packs/day: 1 pack/day for 25.0 years (25.0 ttl pk-yrs)    Types: Cigarettes   Smokeless tobacco: Never  Vaping Use   Vaping status: Never Used  Substance Use Topics   Alcohol use: No   Drug use: No      Family History  Problem Relation Age of Onset   Heart disease Father    ADD / ADHD Daughter    Hyperlipidemia Paternal Uncle    Heart disease Paternal Grandfather      No Known Allergies     REVIEW OF SYSTEMS (Negative unless checked)   Constitutional: [] Weight loss  [] Fever  [] Chills Cardiac: []   Chest pain   [] Chest pressure   [x] Palpitations   [] Shortness of breath when laying flat   [] Shortness of breath at rest   [] Shortness of breath with exertion. Vascular:  [] Pain in legs with walking   [] Pain in legs at rest   [] Pain in legs when laying flat   [] Claudication   [] Pain in feet when walking  [] Pain in feet at rest  [] Pain in feet when laying flat   [] History of DVT   [] Phlebitis   [] Swelling in legs   [] Varicose veins   [] Non-healing ulcers Pulmonary:   [] Uses home oxygen   [] Productive cough   [] Hemoptysis   [] Wheeze  [] COPD   [] Asthma Neurologic:  [x] Dizziness  [] Blackouts   [x] Seizures   [] History of stroke   [] History of TIA  [] Aphasia   [] Temporary blindness   [] Dysphagia   [] Weakness or numbness in arms    [] Weakness or numbness in legs Musculoskeletal:  [] Arthritis   [] Joint swelling   [] Joint pain   [] Low back pain Hematologic:  [] Easy bruising  [] Easy bleeding   [] Hypercoagulable state   [x] Anemic  [] Hepatitis Gastrointestinal:  [] Blood in stool   [] Vomiting blood  [] Gastroesophageal reflux/heartburn   [] Abdominal pain Genitourinary:  [] Chronic kidney disease   [] Difficult urination  [] Frequent urination  [] Burning with urination   [] Hematuria Skin:  [] Rashes   [] Ulcers   [] Wounds Psychological:  [x] History of anxiety   [x]  History of major depression.  Physical Examination  BP 111/74 (BP Location: Right Arm, Patient Position: Sitting, Cuff Size: Large)   Pulse 74   Resp 17   Ht 5\' 4"  (1.626 m)   Wt 254 lb 6.4 oz (115.4 kg)   BMI 43.67 kg/m  Gen:  WD/WN, NAD Head: [place]/AT, No temporalis wasting. Ear/Nose/Throat: Hearing grossly intact, nares w/o erythema or drainage Eyes: Conjunctiva clear. Sclera non-icteric Neck: Supple.  Trachea midline Pulmonary:  Good air movement, no use of accessory muscles.  Cardiac: RRR, no JVD Vascular:  Vessel Right Left  Radial Palpable Palpable       Musculoskeletal: M/S 5/5 throughout.  No deformity or atrophy. Trace LE edema. Neurologic: Sensation grossly intact in extremities.  Symmetrical.  Speech is fluent.  Psychiatric: Judgment intact, Mood & affect appropriate for pt's clinical situation. Dermatologic: No rashes or ulcers noted.  No cellulitis or open wounds.      Labs Recent Results (from the past 2160 hours)  H. pylori breath test     Status: None   Collection Time: 06/04/23  9:27 AM  Result Value Ref Range   H pylori Breath Test Negative Negative  Alpha-Gal Panel     Status: None   Collection Time: 06/04/23  9:27 AM  Result Value Ref Range   Class Description Allergens Comment     Comment:     Levels of Specific IgE       Class  Description of Class     ---------------------------  -----  --------------------                     < 0.10         0         Negative            0.10 -    0.31         0/I       Equivocal/Low            0.32 -    0.55  I         Low            0.56 -    1.40         II        Moderate            1.41 -    3.90         III       High            3.91 -   19.00         IV        Very High           19.01 -  100.00         V         Very High                   >100.00         VI        Very High    IgE (Immunoglobulin E), Serum 47 6 - 495 IU/mL   Pork IgE <0.10 Class 0 kU/L   Beef IgE <0.10 Class 0 kU/L   Allergen Lamb IgE <0.10 Class 0 kU/L   O215-IgE Alpha-Gal <0.10 Class 0 kU/L  Celiac Disease Ab Screen w/Rfx     Status: None   Collection Time: 06/04/23  9:27 AM  Result Value Ref Range   Antigliadin Abs, IgA 8 0 - 19 units    Comment:                    Negative                   0 - 19                    Weak Positive             20 - 30                    Moderate to Strong Positive   >30    Transglutaminase IgA <2 0 - 3 U/mL    Comment:                               Negative        0 -  3                               Weak Positive   4 - 10                               Positive           >10  Tissue Transglutaminase (tTG) has been identified  as the endomysial antigen.  Studies have demonstr-  ated that endomysial IgA antibodies have over 99%  specificity for gluten sensitive enteropathy.    IgA/Immunoglobulin A, Serum 185 87 - 352 mg/dL  Food Allergy  Profile     Status: None   Collection Time: 06/04/23  9:27 AM  Result Value Ref Range   Egg White IgE <0.10 Class 0 kU/L   Peanut IgE <0.10 Class 0 kU/L   Soybean IgE <0.10 Class 0 kU/L   Milk IgE <0.10 Class 0  kU/L   Clam IgE <0.10 Class 0 kU/L   Shrimp IgE <0.10 Class 0 kU/L   Walnut IgE <0.10 Class 0 kU/L   Codfish IgE <0.10 Class 0 kU/L   Scallop IgE <0.10 Class 0 kU/L   Wheat IgE <0.10 Class 0 kU/L   Allergen Corn, IgE <0.10 Class 0 kU/L   Sesame Seed IgE <0.10 Class 0 kU/L  Surgical pathology     Status:  None   Collection Time: 07/01/23 12:00 AM  Result Value Ref Range   SURGICAL PATHOLOGY      SURGICAL PATHOLOGY [facility] [address] [place], [place] [address] Telephone [phone] or [phone] Fax [phone]  REPORT OF SURGICAL PATHOLOGY   Accession #: [id number] Patient Name: ABIA, MONACO Visit # : [id number]  MRN: [id number] Physician: Luke Salaam DOB/Age [birth year] (Age: 66) Gender: F Collected Date: 07/01/2023 Received Date: 07/01/2023  FINAL DIAGNOSIS       1. 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      - IHC FOR H. PYLORI WILL BE REPORTED IN AN ADDENDUM.      - NEGATIVE FOR DYSPLASIA AND MALIGNANCY.       2. Esophagus, biopsy, cbx :       - SQUAMOUS MUCOSA WITH FOCAL GLYCOGEN ACANTHOSIS.      - NEGATIVE FOR INTRAEPITHELIAL EOSINOPHILS, DYSPLASIA, AND MALIGNANCY.       Diagnosis Note : On endoscopy, glycogen acanthosis can appear as a white colored      plaque and represents a mild increase in glycogen within the squamous       cells.Glycogen acanthosis is a non-specific finding but can mimic some of the      endoscopic findings of eosinophilic esophagitis.     CORRECTED [id number]: ADDENDUM: An IHC stain for H. pylori was performed on part 1 and is negative.  Stain controls worked appropriately.  ELECTRONIC SIGNATURE : Rubinas Md, [place] , Sports administrator, Electronic Signature  MICROSCOPIC DESCRIPTION  CASE COMMENTS STAINS USED IN DIAGNOSIS: H&E Universal Negative Control-DAB H&E Stains used in diagnosis 1 H. Pylori IHC Some of these immunohistochemical stains may have been developed and the performance characteristics determined by [facility].  Some may not have been cleared or approved by the U.S. Food and Drug Administration.  The FDA has determined that such clearance or approval is not necessary.  This test is used for  clinical purposes.  It should not be regarded as investigational or for research.  This laboratory is certified under the Clinical Laboratory Imp rovement Amendments of 1988 (CLIA-88) as qualified to perform high complexity clinical laboratory testing.    CLINICAL HISTORY  SPECIMEN(S) OBTAINED 1. Stomach, biopsy, Cbx Random 2. Esophagus, biopsy, Cbx  SPECIMEN COMMENTS: 1. for gastritis 2. rule out EOE SPECIMEN CLINICAL INFORMATION: 1. Gastritis    Gross Description 1. "CBX random gastric for gastritis", received in formalin is a single 0.7 x 0.2 x 0.1 cm tan-pink tissue fragment.The specimen is submitted in toto in 1 block (1A). 2. "CBX esophagus to rule out EOE", recieved in formalin is a 1.0 x 0.7 x 0.1 cm agggregate of multiple white-gray tissue fragments.The specimen is submitted in toto in one block (2A).      [facility] 03.20.2025        Report signed out from the following location(s) [facility] [address] [place], [place] [address] CLIA #:  [id number]  [facility] [address] [place], [place] [address] CLIA #: [id number]     Radiology No results found.  Assessment/Plan  Pain in limb ABIs today are normal at 1.25 on the right and 1.31 on the left with normal triphasic waveforms and normal digital pressures bilaterally.  Arterial insufficiency is not the cause of her lower extremity symptoms.  Recheck as needed.  Dizzinesses Patient has undergone a carotid duplex at [facility] which I have reviewed.  This showed no significant carotid artery disease on either side.  Both subclavian arteries were multiphasic with normal flow.  Both vertebral arteries were antegrade with normal flow.  With this normal duplex, I do not think there is a large vessel vascular cause of her dizziness at this point.   Aortic atherosclerosis (HCC) The patient has undergone a CT scan of the abdomen and pelvis earlier this year which I have  independently reviewed.  This does show a small amount of aortoiliac atherosclerosis without any obvious stenosis identified. Mikki Alexander, MD  08/06/2023 9:12 AM    This note was created with Dragon medical transcription system.  Any errors from dictation are purely unintentional

## 2023-08-06 NOTE — Assessment & Plan Note (Signed)
 ABIs today are normal at 1.25 on the right and 1.31 on the left with normal triphasic waveforms and normal digital pressures bilaterally.  Arterial insufficiency is not the cause of her lower extremity symptoms.  Recheck as needed.

## 2023-08-09 ENCOUNTER — Ambulatory Visit (INDEPENDENT_AMBULATORY_CARE_PROVIDER_SITE_OTHER): Admitting: Gastroenterology

## 2023-08-09 ENCOUNTER — Encounter: Payer: Self-pay | Admitting: Gastroenterology

## 2023-08-09 VITALS — BP 119/73 | HR 74 | Temp 98.2°F | Ht 64.0 in | Wt 257.0 lb

## 2023-08-09 DIAGNOSIS — R1013 Epigastric pain: Secondary | ICD-10-CM

## 2023-08-09 DIAGNOSIS — K2970 Gastritis, unspecified, without bleeding: Secondary | ICD-10-CM

## 2023-08-09 DIAGNOSIS — R197 Diarrhea, unspecified: Secondary | ICD-10-CM

## 2023-08-09 DIAGNOSIS — K31A0 Gastric intestinal metaplasia, unspecified: Secondary | ICD-10-CM

## 2023-08-09 DIAGNOSIS — R634 Abnormal weight loss: Secondary | ICD-10-CM

## 2023-08-09 DIAGNOSIS — K5900 Constipation, unspecified: Secondary | ICD-10-CM

## 2023-08-09 DIAGNOSIS — R932 Abnormal findings on diagnostic imaging of liver and biliary tract: Secondary | ICD-10-CM

## 2023-08-09 LAB — VAS US ABI WITH/WO TBI
Left ABI: 1.31
Right ABI: 1.25

## 2023-08-09 MED ORDER — SUCRALFATE 1 GM/10ML PO SUSP: 1.0000 g | Freq: Four times a day (QID) | ORAL | 1 refills | Status: DC

## 2023-08-09 NOTE — Patient Instructions (Addendum)
 Please take IBGuard and see if it works. If it doesn't, then try taking Restora that could be bought on [employer]. Both could be bought without a prescription.  We will reach out to you when you are due for your EGD and MRI.

## 2023-08-09 NOTE — Progress Notes (Signed)
 Luke Salaam MD, MRCP(U.K) [address]  [address]  [place], [place] [address]  Main: [phone]  Fax: [phone]   Primary Care Physician: Patrina Boos, MD  Primary Gastroenterologist:  Dr. Luke Salaam   Chief Complaint  Patient presents with   Abdominal Pain   loss of weight   Follow-up    HPI: Dana Shaffer is a 48 y.o. female  Summary of history :   She was initially seen on 06/01/2023 by Brigitte Canard for abdominal pain and weight loss.  Symptoms of diarrhea alternating with constipation with abdominal pain going on for a year.  The pain was in the upper part of her abdomen.  Associated with belching bloating gastric burning.  Already had a cardiac evaluation and was negative.  Has not been on a acid suppressing medication.  Lost about 30 pounds of weight over a period of 1 year with diet modification and improvement of her HbA1c.  In January 2025 CT scan of the abdomen pelvis showed ill-defined hypodensity in the liver along the bicipital ligament labs in January 2025 showed no abnormality in CBC CMP.  She had an EGD in 2018 at [facility] showed food in the stomach and duodenum.  Colonoscopy was in 2018 showed a 3 mm in the fissure.   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  06/04/2023: 5 alpha gal panel, celiac serology, food allergy  profile, H. pylori breath test were negative.  She states that she continues to have epigastric discomfort she describes it as a dull pain which  can last few days which she can get every few days or every few weeks and when it does come on it lasts for a very long time and then suddenly disappears no clear exacerbating factors except bending forward and movement and no clear relieving factors.  Not related to food intake not related to bowel movements she does suffer from constipation on and off.  She cannot tolerate a high-fiber diet with grains but consumes lots of vegetables.  Denies any NSAID use.  She has intentionally been trying to lose weight by changing her diet  Current Outpatient Medications  Medication Sig Dispense Refill   Cyanocobalamin (B-12) 500 MCG SUBL Place under the tongue 3 (three) times a week.     levothyroxine (SYNTHROID) 125 MCG tablet Take 125 mcg by mouth daily before breakfast.     omeprazole  (PRILOSEC) 40 MG capsule Take 1 capsule (40 mg total) by mouth daily. 90 capsule 1   REPATHA SURECLICK 140 MG/ML SOAJ Inject into the skin.     vitamin D3 (CHOLECALCIFEROL) 25 MCG tablet Take 1,000 Units by mouth 2 (two) times a week.     No current facility-administered medications for this visit.    Allergies as of 08/09/2023   (No Known Allergies)     ROS:  General: Negative for anorexia, weight loss, fever, chills, fatigue, weakness. ENT: Negative for hoarseness, difficulty swallowing , nasal congestion. CV: Negative for chest pain, angina, palpitations, dyspnea on exertion, peripheral edema.  Respiratory: Negative for dyspnea at rest, dyspnea  on exertion, cough, sputum, wheezing.  GI: See history of present illness. GU:  Negative for dysuria, hematuria, urinary incontinence, urinary frequency, nocturnal urination.  Endo: Negative for unusual weight change.    Physical Examination:   BP 119/73   Pulse 74   Temp 98.2 F (36.8 C)   Ht 5\' 4"  (1.626 m)   Wt 257 lb (116.6 kg)   BMI 44.11 kg/m   General: Well-nourished, well-developed in no acute distress.  Eyes: No icterus. Conjunctivae pink. Abdomen:  Bowel sounds are normal, nontender, nondistended, no hepatosplenomegaly or masses, no abdominal bruits or hernia , no rebound or guarding.   Extremities: No lower extremity edema. No clubbing or deformities. Neuro: Alert and oriented x 3.  Grossly intact. Skin: Warm and dry, no jaundice.   Psych: Alert and cooperative, normal mood and affect.   Imaging Studies: VAS US  ABI WITH/WO TBI Result Date: 08/09/2023  LOWER EXTREMITY DOPPLER STUDY Patient Name:  JESSICIA WESCHLER  Date of Exam:   08/06/2023 Medical Rec #: [id number]        Accession #:    [id number] Date of Birth: 05-May-1975       Patient Gender: F Patient Age:   48 years Exam Location:  [facility] Procedure:      VAS US  ABI WITH/WO TBI Referring Phys: Mikki Alexander --------------------------------------------------------------------------------  Indications: Rest pain.  Performing Technologist: Tonie Franks RVS  Examination Guidelines: A complete evaluation includes at minimum, Doppler waveform signals and systolic blood pressure reading at the level of bilateral brachial, anterior tibial, and posterior tibial arteries, when vessel segments are accessible. Bilateral testing is considered an integral part of a complete examination. Photoelectric Plethysmograph (PPG) waveforms and toe systolic pressure readings are included as required and additional duplex testing as needed. Limited examinations for reoccurring indications may be performed as noted.  ABI Findings: +---------+------------------+-----+---------+--------+ Right    Rt Pressure (mmHg)IndexWaveform Comment  +---------+------------------+-----+---------+--------+ Brachial 132                                      +---------+------------------+-----+---------+--------+ ATA      167               1.17 triphasic         +---------+------------------+-----+---------+--------+ PTA      179               1.25 triphasic          +---------+------------------+-----+---------+--------+ Great Toe174               1.22 Normal            +---------+------------------+-----+---------+--------+ +---------+------------------+-----+---------+-------+ Left     Lt Pressure (mmHg)IndexWaveform Comment +---------+------------------+-----+---------+-------+ Brachial 143                                     +---------+------------------+-----+---------+-------+ ATA      180               1.26 triphasic        +---------+------------------+-----+---------+-------+ PTA      187               1.31 triphasic        +---------+------------------+-----+---------+-------+ Great Toe130               0.91 Normal           +---------+------------------+-----+---------+-------+ +-------+-----------+-----------+------------+------------+  ABI/TBIToday's ABIToday's TBIPrevious ABIPrevious TBI +-------+-----------+-----------+------------+------------+ Right  1.25       1.22                                +-------+-----------+-----------+------------+------------+ Left   1.31       .91                                 +-------+-----------+-----------+------------+------------+  Summary: Right: Resting right ankle-brachial index is within normal range. The right toe-brachial index is normal. Left: Resting left ankle-brachial index is within normal range. The left toe-brachial index is normal. *See table(s) above for measurements and observations.  Electronically signed by Mikki Alexander MD on 08/09/2023 at 11:16:25 AM.    Final     Assessment and Plan:   ROBYN GARANT is a 48 y.o. y/o female here to follow up for weight loss , abdominal pain , gastritis , diarrhea alternating with constipation.  My impression of her epigastric discomfort is that it is likely functional versus musculoskeletal.  Does not fit the pattern of peptic ulcer disease or irritable bowel syndrome.  She is already tried amitriptyline which has not  worked.  I reassured her that her abdominal imaging endoscopy and colonoscopy did not show any other abnormalities.  Plan 1.  11 mm lesion in the liver seen on MRI in February 2025: MRI without and with EOVIST contrast in 6 months ie 11/2023  2. Egd in 6 months for gastric mapping for gastric intestinal metaplasia.   3.  Trial of IBgard samples have been provided if it fails trial of probiotic samples have been provided.  Consider low FODMAP diet patient information provided  I have discussed alternative options, risks & benefits,  which include, but are not limited to, bleeding, infection, perforation,respiratory complication & drug reaction.  The patient agrees with this plan & written consent will be obtained.     Dr Luke Salaam  MD,MRCP [facility]) Follow up in 4 to 5 months

## 2023-08-31 ENCOUNTER — Encounter (INDEPENDENT_AMBULATORY_CARE_PROVIDER_SITE_OTHER): Payer: Self-pay

## 2023-10-14 ENCOUNTER — Telehealth: Payer: Self-pay

## 2023-10-14 NOTE — Telephone Encounter (Signed)
 Pt called stating that she received notification via Mychart that she is due for a follow up... Pt is aware that Dr Therisa is no longer at [facility] and will follow him to [facility].... Information, referral faxed to [facility] and pt will call to schedule appointment to follow up with Dr Therisa

## 2023-10-28 ENCOUNTER — Ambulatory Visit (INDEPENDENT_AMBULATORY_CARE_PROVIDER_SITE_OTHER): Admitting: Nurse Practitioner

## 2023-10-28 VITALS — BP 124/76 | HR 81 | Temp 97.9°F | Resp 18 | Ht 64.0 in | Wt 243.1 lb

## 2023-10-28 DIAGNOSIS — I700 Atherosclerosis of aorta: Secondary | ICD-10-CM

## 2023-10-28 DIAGNOSIS — D500 Iron deficiency anemia secondary to blood loss (chronic): Secondary | ICD-10-CM | POA: Diagnosis not present

## 2023-10-28 DIAGNOSIS — R202 Paresthesia of skin: Secondary | ICD-10-CM | POA: Diagnosis not present

## 2023-10-28 DIAGNOSIS — R4189 Other symptoms and signs involving cognitive functions and awareness: Secondary | ICD-10-CM | POA: Diagnosis not present

## 2023-10-28 DIAGNOSIS — Z114 Encounter for screening for human immunodeficiency virus [HIV]: Secondary | ICD-10-CM

## 2023-10-28 DIAGNOSIS — R600 Localized edema: Secondary | ICD-10-CM

## 2023-10-28 DIAGNOSIS — R42 Dizziness and giddiness: Secondary | ICD-10-CM

## 2023-10-28 DIAGNOSIS — Z1159 Encounter for screening for other viral diseases: Secondary | ICD-10-CM

## 2023-10-28 DIAGNOSIS — Z131 Encounter for screening for diabetes mellitus: Secondary | ICD-10-CM

## 2023-10-28 DIAGNOSIS — E782 Mixed hyperlipidemia: Secondary | ICD-10-CM

## 2023-10-28 NOTE — Progress Notes (Signed)
 BP 124/76   Pulse 81   Temp 97.9 F (36.6 C)   Resp 18   Ht 5' 4 (1.626 m)   Wt 243 lb 1.6 oz (110.3 kg)   SpO2 98%   BMI 41.73 kg/m    Subjective:    Patient ID: Dana Shaffer, female    DOB: 06/15/1975, 48 y.o.   MRN: [id number]  HPI: Dana Shaffer is a 48 y.o. female  Chief Complaint  Patient presents with   Establish Care    Spealists:cardio, neuro, GI,rheum.& endo   Dizziness    Chronic since last year with sternum pain, tingling and numbness, with swelling.  Urine retention and foggy brain and low blood pressure has new patient appt next week with rheum and endo.  Recommended blood work for PCP: vitamin b12, folic acid, vit E, copper, ANA, CRP, Sed ref, myoloma panel    Discussed the use of AI scribe software for clinical note transcription with the patient, who gave verbal consent to proceed.  History of Present Illness Dana Shaffer is a 48 year old female who presents to establish care.  She has been experiencing non-spinning dizziness and a sensation of brain fog for over a year, which worsens with neck movement and heavy lifting. The dizziness is constant and feels like a 'dream state'. Her vision has become blurry, and she experiences sensitivity to light, especially when dizzy. Carbohydrates and artificial sweeteners exacerbate her dizziness.  She has a history of tingling, burning, and prickling sensations all over her body, which began in December 2024. These symptoms are accompanied by muscle twitches and numbness on the left side of her face, arm, and leg. A CT scan without contrast showed no abnormalities. Over time, these neuropathy symptoms have slowly improved but still flare up occasionally.  In February 2025, she developed pitting edema in her legs, which worsens with physical activity. She also experiences urine retention, having to bear down to urinate, and notes that her symptoms improve slightly when her legs are swollen.  She has a  history of sternum pain that began six to seven months after her dizziness started. The pain is localized to the low sternum area. She underwent a cardiology workup, including a stress PET, echocardiogram, and EKG, and was found to have some arterial calcification. An abdominal MRI showed calcification in her aorta.  She has lost approximately 80 pounds since the onset of her symptoms, which she attributes to a decreased appetite possibly related to gastritis. An endoscopy revealed erosive gastritis, and she is currently taking omeprazole  40 mg daily and Carafate  1 gram four times a day.  Her past medical history includes hyperlipidemia and hypothyroidism. She is currently taking levothyroxine 125 mcg daily and Repatha 140 mg twice a month. She was previously on Effexor, meclizine, clonazepam, Wellbutrin, nortriptyline, and gabapentin.         10/28/2023    8:15 AM  Depression screen PHQ 2/9  Decreased Interest 0  Down, Depressed, Hopeless 1  PHQ - 2 Score 1  Altered sleeping 0  Tired, decreased energy 0  Change in appetite 0  Feeling bad or failure about yourself  0  Trouble concentrating 0  Moving slowly or fidgety/restless 0  Suicidal thoughts 0  PHQ-9 Score 1  Difficult doing work/chores Not difficult at all       10/28/2023    8:15 AM  GAD 7 : Generalized Anxiety Score  Nervous, Anxious, on Edge 0  Control/stop worrying 0  Worry too much - different things 0  Trouble relaxing 0  Restless 0  Easily annoyed or irritable 0  Afraid - awful might happen 0  Total GAD 7 Score 0  Anxiety Difficulty Not difficult at all     Relevant past medical, surgical, family and social history reviewed and updated as indicated. Interim medical history since our last visit reviewed. Allergies and medications reviewed and updated.  Review of Systems Ten systems reviewed and is negative except as mentioned in HPI      Objective:     BP 124/76   Pulse 81   Temp 97.9 F (36.6 C)    Resp 18   Ht 5' 4 (1.626 m)   Wt 243 lb 1.6 oz (110.3 kg)   SpO2 98%   BMI 41.73 kg/m    Wt Readings from Last 3 Encounters:  10/28/23 243 lb 1.6 oz (110.3 kg)  08/09/23 257 lb (116.6 kg)  08/06/23 254 lb 6.4 oz (115.4 kg)    Physical Exam Physical Exam GENERAL: Alert, cooperative, well developed, no acute distress. HEENT: Normocephalic, normal oropharynx, moist mucous membranes. CHEST: Clear to auscultation bilaterally, no wheezes, rhonchi, or crackles. CARDIOVASCULAR: Normal heart rate and rhythm, S1 and S2 normal without murmurs. ABDOMEN: Soft, non-tender, non-distended, without organomegaly, normal bowel sounds. EXTREMITIES: No cyanosis or edema. NEUROLOGICAL: Cranial nerve function abnormal with tremor, decreased sensation on left side, moves all extremities without gross motor deficit.   Results for orders placed or performed in visit on 08/06/23  VAS US  ABI WITH/WO TBI   Collection Time: 08/06/23  7:48 AM  Result Value Ref Range   Right ABI 1.25    Left ABI 1.31           Assessment & Plan:   Problem List Items Addressed This Visit       Cardiovascular and Mediastinum   Aortic atherosclerosis (HCC)   Relevant Orders   Lipid panel     Other   Iron deficiency anemia due to chronic blood loss   Relevant Orders   Beta 2 microglobulin, serum   Analyzer ANA IFA w/RFLX Titer/Pattern,Systemic Autoimmune Panel 1   Sed Rate (ESR)   Copper, Blood   C-reactive protein   Vitamin E   Folate   Vitamin B12   Tingling   Relevant Orders   Beta 2 microglobulin, serum   Analyzer ANA IFA w/RFLX Titer/Pattern,Systemic Autoimmune Panel 1   Sed Rate (ESR)   Copper, Blood   C-reactive protein   Vitamin E   Folate   Vitamin B12   Comprehensive metabolic panel with GFR   Tingling in extremities   Relevant Orders   Beta 2 microglobulin, serum   Analyzer ANA IFA w/RFLX Titer/Pattern,Systemic Autoimmune Panel 1   Sed Rate (ESR)   Copper, Blood   C-reactive protein    Vitamin E   Folate   Vitamin B12   Comprehensive metabolic panel with GFR   Dizzinesses   Relevant Orders   Beta 2 microglobulin, serum   Analyzer ANA IFA w/RFLX Titer/Pattern,Systemic Autoimmune Panel 1   Sed Rate (ESR)   Copper, Blood   C-reactive protein   Vitamin E   Folate   Vitamin B12   RPR   Comprehensive metabolic panel with GFR   Other Visit Diagnoses       Brain fog    -  Primary   Relevant Orders   Beta 2 microglobulin, serum   Analyzer ANA IFA w/RFLX Titer/Pattern,Systemic Autoimmune Panel 1  Sed Rate (ESR)   Copper, Blood   C-reactive protein   Vitamin E   Folate   Vitamin B12   RPR   Comprehensive metabolic panel with GFR     Screening for diabetes mellitus         Encounter for hepatitis C screening test for low risk patient       Relevant Orders   Hepatitis C antibody     Screening for HIV without presence of risk factors       Relevant Orders   HIV Antibody (routine testing w rflx)     Mixed hyperlipidemia       Relevant Orders   Lipid panel     Lower extremity edema       Relevant Orders   Comprehensive metabolic panel with GFR   Brain natriuretic peptide        Assessment and Plan Assessment & Plan Chronic neurological symptoms (paresthesia, dizziness, brain fog, tremor) Chronic neurological symptoms including paresthesia, dizziness, brain fog, and tremor have persisted for over a year with some improvement in dizziness. Differential diagnosis includes potential rheumatological or endocrinological causes. Symptoms may be exacerbated by neck movement and carbohydrate intake. - Order B12 level, folic acid, vitamin E, copper level, ANA, sed rate, CRP, and myeloma panel - Order autoimmune panel - has appointment with  rheumatology and endocrinology next week -will wait for results and forward to Dr. Lane.    Aortic atherosclerosis Aortic atherosclerosis with calcification noted on previous imaging.  Iron deficiency anemia Iron  deficiency anemia was not specifically addressed in this encounter.  Hyperlipidemia Hyperlipidemia with last lipid panel in 2024 showing elevated LDL at 188 mg/dL. Currently on Repatha. - Order lipid panel  Hypothyroidism, status post thyroidectomy Hypothyroidism, status post thyroidectomy. Currently on levothyroxine 125 mcg daily. Recent TSH and free T4 levels were normal.  Urinary retention Experiencing urinary retention with difficulty urinating and need to bear down. Symptoms may be related to peripheral edema or other underlying conditions. - Order metabolic panel  Peripheral edema Peripheral edema noted, particularly in the legs, with pitting edema present. Symptoms may be related to fluid retention or other underlying conditions.Has seen vascular.   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 Currently on omeprazole  and Carafate .  Right hepatic hemangioma 11 mm hypervascular lesion in the dome of the right hepatic lobe, likely a hemangioma, noted on MRI. - Follow-up abdominal MRI recommended  Prediabetes Prediabetes with recent A1c at 5.5%. She has modified diet to manage blood sugar levels.        Follow up plan: Return in about 3 months (around 01/28/2024) for follow up.

## 2023-10-29 ENCOUNTER — Ambulatory Visit: Payer: Self-pay | Admitting: Nurse Practitioner

## 2023-11-02 ENCOUNTER — Other Ambulatory Visit: Payer: Self-pay | Admitting: Gastroenterology

## 2023-11-02 DIAGNOSIS — R932 Abnormal findings on diagnostic imaging of liver and biliary tract: Secondary | ICD-10-CM

## 2023-11-03 ENCOUNTER — Encounter: Payer: Self-pay | Admitting: Internal Medicine

## 2023-11-03 ENCOUNTER — Ambulatory Visit: Payer: No Typology Code available for payment source | Attending: Internal Medicine | Admitting: Internal Medicine

## 2023-11-03 VITALS — BP 115/70 | HR 69 | Resp 14 | Ht 64.0 in | Wt 244.0 lb

## 2023-11-03 DIAGNOSIS — R899 Unspecified abnormal finding in specimens from other organs, systems and tissues: Secondary | ICD-10-CM | POA: Insufficient documentation

## 2023-11-03 DIAGNOSIS — R600 Localized edema: Secondary | ICD-10-CM | POA: Diagnosis not present

## 2023-11-03 DIAGNOSIS — R42 Dizziness and giddiness: Secondary | ICD-10-CM | POA: Diagnosis not present

## 2023-11-03 DIAGNOSIS — R202 Paresthesia of skin: Secondary | ICD-10-CM | POA: Diagnosis not present

## 2023-11-03 DIAGNOSIS — F17200 Nicotine dependence, unspecified, uncomplicated: Secondary | ICD-10-CM | POA: Diagnosis not present

## 2023-11-03 NOTE — Progress Notes (Signed)
 Office Visit Note  Patient: Dana Shaffer             Date of Birth: 1975/12/17           MRN: [id number]             PCP: Gareth Mliss FALCON, FNP Referring: Marea Selinda RAMAN, MD Visit Date: 11/03/2023 Occupation: [facility]  Subjective:  New Patient (Initial Visit) (Patient states she is having pain on the outside of her legs that goes from her hips to her knees and sometimes her calves. Patient states she does not know if the pain is muscular or nerve related. Patient states her legs get worse the more she walks. )   Discussed the use of AI scribe software for clinical note transcription with the patient, who gave verbal consent to proceed.  History of Present Illness   Dana Shaffer is a 48 year old female who presents with numerous systemic symptoms including dizziness, skin rashes, neuropathy, and bilateral leg pain.  Over about the past two years, she has experienced persistent flushing of the face, neck, and ears. She reports initially suspected as attributable to menopause. She underwent hysterectomy and unilateral oophorectomy 5 years ago. Gabapentin was prescribed, which led to a sensation of extreme cold, hypotension, and bradycardia. She subsequently developed constant dizziness described as a 'drunk, dream-like' state, along with memory issues.  In January 2024, she began experiencing new leg pain. The pain is described as aching and sometimes sharp, primarily affecting the outer thighs and occasionally the front of the legs. Walking exacerbates the pain, leading to weakness and difficulty climbing stairs. The pain does not radiate below the knee and no visible skin or joint changes when it is active. She also reports peripheral edema, which worsens with prolonged standing or walking.  In December 2024, she experienced a sudden onset of tingling, burning sensations, and muscle twitching throughout her body, which she refers to as neuropathy. These symptoms have slowly improved over  months but fluctuate, worsening with carbohydrate intake. Several medications tried during frequent follow up with her neurologist Dr. Lane in in the past year which she found were not helpful or caused intolerance usually from GI effects. Including Cymbalta, Effexor, wellbutrin, notriptyline, klonopin, and abilify. A NCS study was unremarkable.  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 The pain was described as an 'achy' sensation, unlike heartburn. Another endoscopy is planned to take biopsies due to metaplasia. She is now on PPI therapy and pain is improved.  She has experienced significant weight loss attributed to a low-carb diet initiated after an elevated A1c of 5.7. Weight is down about 30 lbs in the past year- compared to cardiology office visit in 11/2022. She self reports about 70-80 lbs loss in total from her max. Carbohydrates and artificial sweeteners exacerbate her dizziness.  She also reports cold hands, mottled skin but not consistently provoked with temperature or specific activities. She does not see visible discoloration in fingers or toes, and no nail pitting or lesions. She gets a sore in her mouth that recurs a few times a year. One time this was diagnosed as shingles which she disputes.   She smokes a pack of cigarettes per day with 25 pack-year total history.   She has tried various medications for her symptoms but reports that most exacerbate her dizziness and neuropathy. She has reduced her thyroid medication dose, which  she feels has slightly improved her dizziness.     Labs reviewed 10/2023 Analyzer panel neg ESR 22 CRP 9.5  05/2023 Ttg/gliadin/IgA wnl Alpha-gal neg  Imaging reviewed 02/01/23 MRI Cervical Spine IMPRESSION: 1. Multilevel cervical spondylosis with diffuse spinal stenosis at C3-4 through C7-T1, most pronounced at C5-6 where stenosis is mild-to-moderate in  nature. 2. Multilevel foraminal narrowing due to disc bulging and uncovertebral disease as above. Notable findings include moderate to severe left C4 foraminal stenosis, severe right with mild-to-moderate left C5 foraminal narrowing, with mild left C6 foraminal stenosis.  11/23/22 MRI Brain IMPRESSION: 1. No evidence of an acute intracranial abnormality. 2. No cerebellopontine angle or internal auditory canal mass. 3. Multifocal T2 FLAIR hyperintense signal abnormality within the cerebral white matter, mild but greater than expected for age. These signal changes are nonspecific, but most often secondary to chronic small vessel ischemia.  Activities of Daily Living:  Patient reports morning stiffness for less than 5 minutes.   Patient Denies nocturnal pain.  Difficulty dressing/grooming: Denies Difficulty climbing stairs: Reports Difficulty getting out of chair: Denies Difficulty using hands for taps, buttons, cutlery, and/or writing: Denies  Review of Systems  Constitutional:  Positive for fatigue.  HENT:  Positive for mouth sores and mouth dryness.   Eyes:  Positive for dryness.  Respiratory:  Positive for shortness of breath.   Cardiovascular:  Positive for chest pain. Negative for palpitations.  Gastrointestinal:  Positive for constipation. Negative for blood in stool and diarrhea.  Endocrine: Negative for increased urination.  Genitourinary:  Negative for involuntary urination.  Musculoskeletal:  Positive for joint swelling, myalgias, muscle weakness, morning stiffness, muscle tenderness and myalgias. Negative for joint pain, gait problem and joint pain.  Skin:  Positive for color change, rash and sensitivity to sunlight. Negative for hair loss.  Allergic/Immunologic: Negative for susceptible to infections.  Neurological:  Positive for dizziness and headaches.  Hematological:  Negative for swollen glands.  Psychiatric/Behavioral:  Positive for sleep disturbance. Negative for  depressed mood. The patient is nervous/anxious.     PMFS History:  Patient Active Problem List   Diagnosis Date Noted   Peripheral edema 11/03/2023   Abnormal laboratory test result 11/03/2023   Abdominal pain, epigastric 07/01/2023   Colon cancer screening 07/01/2023   Dizzinesses 06/08/2023   Aortic atherosclerosis (HCC) 06/08/2023   Pain in limb 06/08/2023   Social anxiety disorder 02/06/2019   Attention and concentration deficit 02/06/2019   Specific phobia 02/06/2019   Tobacco use disorder 02/06/2019   Tingling 04/08/2017   Iron deficiency anemia due to chronic blood loss 03/01/2017   Blurred vision 02/19/2017   Headache disorder 02/19/2017   Sleep disorder 02/19/2017   Tingling in extremities 02/19/2017    Past Medical History:  Diagnosis Date   Anemia    IRON INFUSION IN DEC 2018   Anxiety    Complication of anesthesia    AFTER HYSTERECTOMY, HAD TROUBLE BREATHING   Depression    Gastritis    GERD (gastroesophageal reflux disease)    RARE-NO MEDS   Hyperlipidemia    Hypothyroidism    Hypothyroidism    OSA (obstructive sleep apnea)    Seizures (HCC)    AS A CHILD X 5-6 TIMES   Sleep apnea     RECENTLY DX AND NO CPAP   Tachycardia    PTS PCP JUST TOOK PT OFF OF HER [facility] ON 05-11-17 THINKING THIS MAY BE CAUSING TACHYCARDIA    Family History  Problem Relation Age of Onset   Hypertension  Mother    Hyperlipidemia Mother    Thyroid disease Mother    Heart disease Father    ADD / ADHD Daughter    Hyperlipidemia Paternal Uncle    Heart disease Paternal Grandfather    Past Surgical History:  Procedure Laterality Date   ABDOMINAL HYSTERECTOMY     CHOLECYSTECTOMY     COLONOSCOPY WITH ESOPHAGOGASTRODUODENOSCOPY (EGD)     COLONOSCOPY WITH PROPOFOL  N/A 07/01/2023   Procedure: COLONOSCOPY WITH PROPOFOL ;  Surgeon: Therisa Bi, MD;  Location: [facility] ENDOSCOPY;  Service: Gastroenterology;  Laterality: N/A;   ESOPHAGOGASTRODUODENOSCOPY (EGD) WITH PROPOFOL  N/A  07/01/2023   Procedure: ESOPHAGOGASTRODUODENOSCOPY (EGD) WITH PROPOFOL ;  Surgeon: Therisa Bi, MD;  Location: [facility] ENDOSCOPY;  Service: Gastroenterology;  Laterality: N/A;   LAPAROSCOPY N/A 05/21/2017   Procedure: Exploratory laporscropsy, lyses of adhesions, left cyst wall removal;  Surgeon: Ward, Mitzie BROCKS, MD;  Location: [facility] ORS;  Service: Gynecology;  Laterality: N/A;  Lysis of Adhesions   THYROIDECTOMY     AGE 52   Social History   Social History Narrative   Not on file   Immunization History  Administered Date(s) Administered   Influenza-Unspecified 02/08/2017     Objective: Vital Signs: BP 115/70 (BP Location: Right Arm, Patient Position: Sitting, Cuff Size: Large)   Pulse 69   Resp 14   Ht 5' 4 (1.626 m)   Wt 244 lb (110.7 kg)   BMI 41.88 kg/m    Physical Exam Constitutional:      Appearance: She is obese.  HENT:     Mouth/Throat:     Mouth: Mucous membranes are dry.     Pharynx: Oropharynx is clear.  Eyes:     Conjunctiva/sclera: Conjunctivae normal.  Cardiovascular:     Rate and Rhythm: Normal rate and regular rhythm.  Pulmonary:     Effort: Pulmonary effort is normal.     Breath sounds: Normal breath sounds.  Lymphadenopathy:     Cervical: No cervical adenopathy.  Skin:    General: Skin is warm and dry.     Findings: Rash present.     Comments: Livedo reticularis on upper and lower arms, throughout legs on medial side Normal appearing nailfold capillaries No digital pitting  Neurological:     Mental Status: She is alert.  Psychiatric:        Mood and Affect: Mood normal.      Musculoskeletal Exam:  Shoulders full ROM no tenderness or swelling Elbows full ROM no tenderness or swelling Wrists full ROM no tenderness or swelling Fingers full ROM no tenderness or swelling No paraspinal tenderness to palpation over upper and lower back Hip normal internal and external rotation without pain, no tenderness to lateral hip palpation Knees full ROM no  tenderness or swelling Bony nodules on achilles insertion b/l without tenderness, flat feet    Investigation: No additional findings.  Imaging: No results found.  Recent Labs: Lab Results  Component Value Date   WBC 8.0 04/18/2023   HGB 14.5 04/18/2023   PLT 309 04/18/2023   NA 139 10/28/2023   K 4.4 10/28/2023   CL 105 10/28/2023   CO2 28 10/28/2023   GLUCOSE 75 10/28/2023   BUN 18 10/28/2023   CREATININE 0.66 10/28/2023   BILITOT 0.6 10/28/2023   ALKPHOS 67 04/18/2023   AST 10 10/28/2023   ALT 10 10/28/2023   PROT 6.5 10/28/2023   ALBUMIN 4.3 04/18/2023   CALCIUM 9.5 10/28/2023    Speciality Comments: No specialty comments available.  Procedures:  No  procedures performed Allergies: Patient has no known allergies.   Assessment / Plan:     Visit Diagnoses: Tingling in extremities - Plan: Sedimentation rate, C3 and C4, IgG, IgA, IgM, Beta-2 glycoprotein antibodies, Cardiolipin antibodies, IgG, IgM, IgA MS. Bessinger has complaints crossing multiple systems with very little identifiable on labs, neuroimaging and vascular imaging, or exam today. Agree with suspected at least partial functional component of neurologic symptoms which might fit with limited response to multiple drugs. Symptoms otherwise may fit with small fiber neuropathy maybe inflammatory maybe vascular given her other symptoms and ongoing smoking history. Not sure why symptoms are fluctuating with carbohydrate intake, as she does not have impaired glucose tolerance or diabetes based on labs. Nerve conduction studies unremarkable. Apparently tried without success numerous nerve agents as detailed above. Discussed low dose naltrexone, reassured about low side effect profile. - Consider low dose naltrexone as recommended by her other providers after her upcoming trip. - Rechecking labs as above  Tobacco use disorder Discussed impact of smoking on small vessel other vascular disease. Also with history of  erosive gastritis with metaplasia.   Abnormal laboratory test result - Plan: Sedimentation rate, C3 and C4, IgG, IgA, IgM, Beta-2 glycoprotein antibodies, Cardiolipin antibodies, IgG, IgM, IgA Elevated eSR and CRP not much else on fairly extensive testing already. Will check complements, APS, immunoglobulins, and repeat sed rate. ESR may be explained by smoking as well especially if persistent low level.  Edema Leg edema likely due to venous insufficiency exacerbated by prolonged standing and walking. Compression socks increased dizziness. - Consider light compression socks if tolerated. - Encourage alternating sitting and standing positions to reduce edema.  Dizziness Persistent dizziness worsened by medications and certain foods, improves with physical activity, suggesting cardiovascular component. Counseled on need for exercise for cardiovascular conditioning. - Encourage regular physical activity as tolerated. - Provided link info for modified POTS exercises  Cold intolerance Cold intolerance with mottled skin began after gabapentin, possible relation to Raynaud's phenomenon.   Bilateral hip pain Distribution limited to lateral thighs and pain with walking may fit with meralgia paresthetica. Other possibilities including IT band syndrome, or referred pain but does not have much in back or gluteal area during exacerbations.   Orders: Orders Placed This Encounter  Procedures   Sedimentation rate   C3 and C4   IgG, IgA, IgM   Beta-2 glycoprotein antibodies   Cardiolipin antibodies, IgG, IgM, IgA   No orders of the defined types were placed in this encounter.  65 minutes spent on encounter today including review of numerous outside clinic notes and test results, detailed history and exam, counseling on numerous individual symptom complaints, ordering, and documentation.  Follow-Up Instructions: Return if symptoms worsen or fail to improve.   Lonni LELON Ester, MD  Note - This  record has been created using [employer].  Chart creation errors have been sought, but may not always  have been located. Such creation errors do not reflect on  the standard of medical care.

## 2023-11-03 NOTE — Patient Instructions (Signed)
 I recommend trying to add some of the exercises as detailed in this for the muscles and for dizziness:  [email or web address]

## 2023-11-04 LAB — VITAMIN B12: Vitamin B-12: 243 pg/mL (ref 200–1100)

## 2023-11-04 LAB — ANALYZER(R)ANA IFA WITH REFLEX TITER/PATTRN,SYS AUTOIMM PNL1
Anti Nuclear Antibody (ANA): NEGATIVE
Anticardiolipin IgA: <2 [APL'U]/mL
Anticardiolipin IgG: <2 [GPL'U]/mL
Anticardiolipin IgM: <2 [MPL'U]/mL
Beta-2 Glyco 1 IgA: <2 U/mL
Beta-2 Glyco 1 IgM: <2 U/mL
Beta-2 Glyco I IgG: <2 U/mL
C3 Complement: 139 mg/dL (ref 83–193)
C4 Complement: 25 mg/dL (ref 15–57)
Centromere Ab Screen: <1 AI
Chromatin (Nucleosomal) Antibody: <1 AI
Cyclic Citrullin Peptide Ab: <16 U
DNA Ab (DS) Crithidia, IFA: NEGATIVE
ENA SM Ab Ser-aCnc: <1 AI
Jo-1 Autoabs: <1 AI
MUTATED CITRULLINATED VIMENTIN (MCV) AB: <20 U/mL (ref <20)
Rheumatoid Factor (IgA): <5 U
Rheumatoid Factor (IgG): <5 U
Rheumatoid Factor (IgM): <5 U
Ribonucleic Protein(ENA) Antibody, IgG: <1 AI
SM/RNP: <1 AI
SSA (Ro) (ENA) Antibody, IgG: <1 AI
SSB (La) (ENA) Antibody, IgG: <1 AI
Scleroderma (Scl-70) (ENA) Antibody, IgG: <1 AI
Thyroperoxidase Ab SerPl-aCnc: 3 [IU]/mL (ref <9)

## 2023-11-04 LAB — COMPREHENSIVE METABOLIC PANEL WITH GFR
AG Ratio: 2 (calc) (ref 1.0–2.5)
ALT: 10 U/L (ref 6–29)
AST: 10 U/L (ref 10–35)
Albumin: 4.3 g/dL (ref 3.6–5.1)
Alkaline phosphatase (APISO): 58 U/L (ref 31–125)
BUN: 18 mg/dL (ref 7–25)
CO2: 28 mmol/L (ref 20–32)
Calcium: 9.5 mg/dL (ref 8.6–10.2)
Chloride: 105 mmol/L (ref 98–110)
Creat: 0.66 mg/dL (ref 0.50–0.99)
Globulin: 2.2 g/dL (ref 1.9–3.7)
Glucose, Bld: 75 mg/dL (ref 65–99)
Potassium: 4.4 mmol/L (ref 3.5–5.3)
Sodium: 139 mmol/L (ref 135–146)
Total Bilirubin: 0.6 mg/dL (ref 0.2–1.2)
Total Protein: 6.5 g/dL (ref 6.1–8.1)
eGFR: 109 mL/min/1.73m2 (ref >=60)

## 2023-11-04 LAB — COPPER, BLOOD: Copper, Blood: 130 ug/dL

## 2023-11-04 LAB — LIPID PANEL
Cholesterol: 252 mg/dL — ABNORMAL HIGH (ref <200)
HDL: 36 mg/dL — ABNORMAL LOW (ref >=50)
LDL Cholesterol (Calc): 200 mg/dL — ABNORMAL HIGH
Non-HDL Cholesterol (Calc): 216 mg/dL — ABNORMAL HIGH (ref <130)
Total CHOL/HDL Ratio: 7 (calc) — ABNORMAL HIGH (ref <5.0)
Triglycerides: 61 mg/dL (ref <150)

## 2023-11-04 LAB — VITAMIN E
Gamma-Tocopherol (Vit E): <1 mg/L (ref <4.4)
Vitamin E (Alpha Tocopherol): 10.1 mg/L (ref 5.7–19.9)

## 2023-11-04 LAB — HEPATITIS C ANTIBODY: Hepatitis C Ab: NONREACTIVE

## 2023-11-04 LAB — RPR: RPR Ser Ql: NONREACTIVE

## 2023-11-04 LAB — HIV ANTIBODY (ROUTINE TESTING W REFLEX): HIV 1&2 Ab, 4th Generation: NONREACTIVE

## 2023-11-04 LAB — FOLATE: Folate: 6.4 ng/mL

## 2023-11-04 LAB — C-REACTIVE PROTEIN: CRP: 9.5 mg/L — ABNORMAL HIGH (ref <8.0)

## 2023-11-04 LAB — BETA 2 MICROGLOBULIN, SERUM: Beta-2 Microglobulin: 1.93 mg/L (ref <=2.51)

## 2023-11-04 LAB — BRAIN NATRIURETIC PEPTIDE: Brain Natriuretic Peptide: 16 pg/mL (ref <100)

## 2023-11-04 LAB — SEDIMENTATION RATE: Sed Rate: 22 mm/h — ABNORMAL HIGH (ref 0–20)

## 2023-11-05 ENCOUNTER — Other Ambulatory Visit: Payer: Self-pay | Admitting: Nurse Practitioner

## 2023-11-05 LAB — CARDIOLIPIN ANTIBODIES, IGG, IGM, IGA
Anticardiolipin IgA: <2 [APL'U]/mL (ref <20.0)
Anticardiolipin IgG: <2 [GPL'U]/mL (ref <20.0)
Anticardiolipin IgM: <2 [MPL'U]/mL (ref <20.0)

## 2023-11-05 LAB — SEDIMENTATION RATE: Sed Rate: 14 mm/h (ref 0–20)

## 2023-11-05 LAB — IGG, IGA, IGM
IgG (Immunoglobin G), Serum: 1042 mg/dL (ref 600–1640)
IgM, Serum: 70 mg/dL (ref 50–300)
Immunoglobulin A: 174 mg/dL (ref 47–310)

## 2023-11-05 LAB — BETA-2 GLYCOPROTEIN ANTIBODIES
Beta-2 Glyco 1 IgA: <2 U/mL (ref <20.0)
Beta-2 Glyco 1 IgM: <2 U/mL (ref <20.0)
Beta-2 Glyco I IgG: <2 U/mL (ref <20.0)

## 2023-11-05 LAB — C3 AND C4
C3 Complement: 123 mg/dL (ref 83–193)
C4 Complement: 23 mg/dL (ref 15–57)

## 2023-11-15 ENCOUNTER — Encounter: Payer: Self-pay | Admitting: Nurse Practitioner

## 2023-11-19 ENCOUNTER — Other Ambulatory Visit

## 2023-11-19 ENCOUNTER — Ambulatory Visit
Admission: RE | Admit: 2023-11-19 | Discharge: 2023-11-19 | Disposition: A | Discharge status: Home / Self Care | Source: Ambulatory Visit | Attending: Gastroenterology | Admitting: Gastroenterology

## 2023-11-19 DIAGNOSIS — R932 Abnormal findings on diagnostic imaging of liver and biliary tract: Secondary | ICD-10-CM | POA: Insufficient documentation

## 2023-11-19 MED ORDER — GADOXETATE DISODIUM 0.25 MMOL/ML IV SOLN
10.0000 mL | Freq: Once | INTRAVENOUS | Status: AC | PRN
  Administered 2023-11-19: 10 mL via INTRAVENOUS

## 2023-11-22 ENCOUNTER — Other Ambulatory Visit: Payer: Self-pay | Admitting: Gastroenterology

## 2023-11-22 DIAGNOSIS — R932 Abnormal findings on diagnostic imaging of liver and biliary tract: Secondary | ICD-10-CM

## 2023-11-26 ENCOUNTER — Other Ambulatory Visit: Payer: Self-pay

## 2023-11-26 ENCOUNTER — Encounter
Admission: RE | Disposition: A | Discharge status: Home / Self Care | Payer: Self-pay | Source: Home / Self Care | Attending: Gastroenterology

## 2023-11-26 ENCOUNTER — Ambulatory Visit: Admitting: Certified Registered Nurse Anesthetist

## 2023-11-26 ENCOUNTER — Encounter: Payer: Self-pay | Admitting: Gastroenterology

## 2023-11-26 ENCOUNTER — Ambulatory Visit
Admission: RE | Admit: 2023-11-26 | Discharge: 2023-11-26 | Disposition: A | Discharge status: Home / Self Care | Attending: Gastroenterology | Admitting: Gastroenterology

## 2023-11-26 DIAGNOSIS — K31A11 Gastric intestinal metaplasia without dysplasia, involving the antrum: Secondary | ICD-10-CM | POA: Diagnosis present

## 2023-11-26 DIAGNOSIS — K2950 Unspecified chronic gastritis without bleeding: Secondary | ICD-10-CM | POA: Diagnosis not present

## 2023-11-26 HISTORY — PX: ESOPHAGOGASTRODUODENOSCOPY: SHX5428

## 2023-11-26 SURGERY — EGD (ESOPHAGOGASTRODUODENOSCOPY)
Anesthesia: General

## 2023-11-26 MED ORDER — LIDOCAINE HCL (PF) 2 % IJ SOLN
INTRAMUSCULAR | Status: AC
  Filled 2023-11-26: qty 5

## 2023-11-26 MED ORDER — LIDOCAINE HCL (CARDIAC) PF 100 MG/5ML IV SOSY
PREFILLED_SYRINGE | INTRAVENOUS | Status: DC | PRN
  Administered 2023-11-26: 100 mg via INTRAVENOUS

## 2023-11-26 MED ORDER — PROPOFOL 500 MG/50ML IV EMUL
INTRAVENOUS | Status: DC | PRN
  Administered 2023-11-26: 150 ug/kg/min via INTRAVENOUS

## 2023-11-26 MED ORDER — SODIUM CHLORIDE 0.9 % IV SOLN: INTRAVENOUS | Status: DC

## 2023-11-26 MED ORDER — PROPOFOL 10 MG/ML IV BOLUS
INTRAVENOUS | Status: DC | PRN
  Administered 2023-11-26: 100 mg via INTRAVENOUS

## 2023-11-26 NOTE — Anesthesia Preprocedure Evaluation (Signed)
 Anesthesia Evaluation  Patient identified by MRN, date of birth, ID band Patient awake    Reviewed: Allergy  & Precautions, NPO status , Patient's Chart, lab work & pertinent test results  Airway Mallampati: II  TM Distance: >3 FB Neck ROM: full    Dental  (+) Teeth Intact   Pulmonary neg pulmonary ROS, sleep apnea , COPD, Current Smoker and Patient abstained from smoking.   Pulmonary exam normal  + decreased breath sounds      Cardiovascular Exercise Tolerance: Good negative cardio ROS Normal cardiovascular exam Rhythm:Regular Rate:Normal     Neuro/Psych  Headaches  Anxiety     negative neurological ROS  negative psych ROS   GI/Hepatic negative GI ROS, Neg liver ROS,GERD  ,,  Endo/Other  negative endocrine ROSHypothyroidism  Class 3 obesity  Renal/GU negative Renal ROS  negative genitourinary   Musculoskeletal negative musculoskeletal ROS (+)    Abdominal  (+) + obese  Peds negative pediatric ROS (+)  Hematology negative hematology ROS (+) Blood dyscrasia, anemia   Anesthesia Other Findings Past Medical History: No date: Anemia     Comment:  IRON INFUSION IN DEC 2018 No date: Anxiety No date: Complication of anesthesia     Comment:  AFTER HYSTERECTOMY, HAD TROUBLE BREATHING No date: Depression No date: Gastritis No date: GERD (gastroesophageal reflux disease)     Comment:  RARE-NO MEDS No date: Hyperlipidemia No date: Hypothyroidism No date: Hypothyroidism No date: OSA (obstructive sleep apnea) No date: Seizures (HCC)     Comment:  AS A CHILD X 5-6 TIMES No date: Sleep apnea     Comment:   RECENTLY DX AND NO CPAP No date: Tachycardia     Comment:  PTS PCP JUST TOOK PT OFF OF HER [facility] ON 05-11-17              THINKING THIS MAY BE CAUSING TACHYCARDIA  Past Surgical History: No date: ABDOMINAL HYSTERECTOMY No date: CHOLECYSTECTOMY No date: COLONOSCOPY WITH ESOPHAGOGASTRODUODENOSCOPY  (EGD) 07/01/2023: COLONOSCOPY WITH PROPOFOL ; N/A     Comment:  Procedure: COLONOSCOPY WITH PROPOFOL ;  Surgeon: Therisa Bi, MD;  Location: [facility] ENDOSCOPY;  Service:               Gastroenterology;  Laterality: N/A; 07/01/2023: ESOPHAGOGASTRODUODENOSCOPY (EGD) WITH PROPOFOL ; N/A     Comment:  Procedure: ESOPHAGOGASTRODUODENOSCOPY (EGD) WITH               PROPOFOL ;  Surgeon: Therisa Bi, MD;  Location: [facility]               ENDOSCOPY;  Service: Gastroenterology;  Laterality: N/A; 05/21/2017: LAPAROSCOPY; N/A     Comment:  Procedure: Exploratory laporscropsy, lyses of adhesions,              left cyst wall removal;  Surgeon: Ward, Mitzie BROCKS, MD;                Location: [facility] ORS;  Service: Gynecology;  Laterality:               N/A;  Lysis of Adhesions No date: THYROIDECTOMY     Comment:  AGE 48     Reproductive/Obstetrics negative OB ROS                              Anesthesia Physical Anesthesia Plan  ASA: 3  Anesthesia Plan: General   Post-op  Pain Management:    Induction: Intravenous  PONV Risk Score and Plan: Propofol  infusion and TIVA  Airway Management Planned: Natural Airway and Nasal Cannula  Additional Equipment:   Intra-op Plan:   Post-operative Plan:   Informed Consent: I have reviewed the patients History and Physical, chart, labs and discussed the procedure including the risks, benefits and alternatives for the proposed anesthesia with the patient or authorized representative who has indicated his/her understanding and acceptance.     Dental Advisory Given  Plan Discussed with: CRNA  Anesthesia Plan Comments:         Anesthesia Quick Evaluation

## 2023-11-26 NOTE — Anesthesia Postprocedure Evaluation (Signed)
 Anesthesia Post Note  Patient: Dana Shaffer  Procedure(s) Performed: EGD (ESOPHAGOGASTRODUODENOSCOPY)  Patient location during evaluation: PACU Anesthesia Type: General Level of consciousness: awake and awake and alert Pain management: satisfactory to patient Vital Signs Assessment: post-procedure vital signs reviewed and stable Respiratory status: spontaneous breathing and nonlabored ventilation Cardiovascular status: stable Anesthetic complications: no   No notable events documented.   Last Vitals:  Vitals:   11/26/23 0830 11/26/23 0841  BP: 124/78 120/76  Pulse: 77 70  Resp: 15 10  Temp: (!) 36.1 C (!) 36.1 C  SpO2: 100% 100%    Last Pain:  Vitals:   11/26/23 0841  TempSrc:   PainSc: 0-No pain                 VAN STAVEREN,Dewitt Judice

## 2023-11-26 NOTE — H&P (Signed)
 Ruel Kung , MD [address], [address], [place], [place], [address] Phone: [phone] Fax: [phone]  Primary Care Physician:  Gareth Mliss FALCON, FNP   Pre-Procedure History & Physical: HPI:  Dana Shaffer is a 48 y.o. female is here for an endoscopy    Past Medical History:  Diagnosis Date   Anemia    IRON INFUSION IN DEC 2018   Anxiety    Complication of anesthesia    AFTER HYSTERECTOMY, HAD TROUBLE BREATHING   Depression    Gastritis    GERD (gastroesophageal reflux disease)    RARE-NO MEDS   Hyperlipidemia    Hypothyroidism    Hypothyroidism    OSA (obstructive sleep apnea)    Seizures (HCC)    AS A CHILD X 5-6 TIMES   Sleep apnea     RECENTLY DX AND NO CPAP   Tachycardia    PTS PCP JUST TOOK PT OFF OF HER [facility] ON 05-11-17 THINKING THIS MAY BE CAUSING TACHYCARDIA    Past Surgical History:  Procedure Laterality Date   ABDOMINAL HYSTERECTOMY     CHOLECYSTECTOMY     COLONOSCOPY WITH ESOPHAGOGASTRODUODENOSCOPY (EGD)     COLONOSCOPY WITH PROPOFOL  N/A 07/01/2023   Procedure: COLONOSCOPY WITH PROPOFOL ;  Surgeon: Kung Ruel, MD;  [facility] ENDOSCOPY;  Service: Gastroenterology;  Laterality: N/A;   ESOPHAGOGASTRODUODENOSCOPY (EGD) WITH PROPOFOL  N/A 07/01/2023   Procedure: ESOPHAGOGASTRODUODENOSCOPY (EGD) WITH PROPOFOL ;  Surgeon: Kung Ruel, MD;  Location: [facility] ENDOSCOPY;  Service: Gastroenterology;  Laterality: N/A;   LAPAROSCOPY N/A 05/21/2017   Procedure: Exploratory laporscropsy, lyses of adhesions, left cyst wall removal;  Surgeon: Ward, Mitzie BROCKS, MD;  Location: [facility] ORS;  Service: Gynecology;  Laterality: N/A;  Lysis of Adhesions   THYROIDECTOMY     AGE 57    Prior to Admission medications   Medication Sig Start Date End Date Taking? Authorizing Provider  levothyroxine (SYNTHROID) 125 MCG tablet Take 125 mcg by mouth daily before breakfast. 11/23/18  Yes [provider]  omeprazole  (PRILOSEC) 40 MG capsule Take 1 capsule (40 mg  total) by mouth daily. 07/20/23  Yes Kung Ruel, MD  vitamin D3 (CHOLECALCIFEROL) 25 MCG tablet Take 1,000 Units by mouth 2 (two) times a week.    [provider]    Allergies as of 10/25/2023   (No Known Allergies)    Family History  Problem Relation Age of Onset   Hypertension Mother    Hyperlipidemia Mother    Thyroid disease Mother    Heart disease Father    ADD / ADHD Daughter    Hyperlipidemia Paternal Uncle    Heart disease Paternal Grandfather     Social History   Socioeconomic History   Marital status: Single    Spouse name: Not on file   Number of children: 1   Years of education: Not on file   Highest education level: GED or equivalent  Occupational History   Not on file  Tobacco Use   Smoking status: Every Day    Current packs/day: 1.00    Average packs/day: 1 pack/day for 25.0 years (25.0 ttl pk-yrs)    Types: Cigarettes    Passive exposure: Past   Smokeless tobacco: Never  Vaping Use   Vaping status: Former  Substance and Sexual Activity   Alcohol use: Not Currently   Drug use: No   Sexual activity: Not Currently  Other Topics Concern   Not on file  Social History Narrative   Not on file  Social Drivers of Corporate investment banker Strain: Low Risk  (10/28/2023)   Overall Financial Resource Strain (CARDIA)    Difficulty of Paying Living Expenses: Not hard at all  Food Insecurity: No Food Insecurity (10/28/2023)   Hunger Vital Sign    Worried About Running Out of Food in the Last Year: Never true    Ran Out of Food in the Last Year: Never true  Transportation Needs: No Transportation Needs (10/28/2023)   PRAPARE - Administrator, Civil Service (Medical): No    Lack of Transportation (Non-Medical): No  Physical Activity: Sufficiently Active (10/28/2023)   Exercise Vital Sign    Days of Exercise per Week: 7 days    Minutes of Exercise per Session: 40 min  Stress: No Stress Concern Present (10/28/2023)   [employer] of  Occupational Health - Occupational Stress Questionnaire    Feeling of Stress: Only a little  Social Connections: Socially Isolated (10/28/2023)   Social Connection and Isolation Panel    Frequency of Communication with Friends and Family: Twice a week    Frequency of Social Gatherings with Friends and Family: Never    Attends Religious Services: Never    Database administrator or Organizations: No    Attends Engineer, structural: Not on file    Marital Status: Never married  Intimate Partner Violence: Not At Risk (10/28/2023)   Humiliation, Afraid, Rape, and Kick questionnaire    Fear of Current or Ex-Partner: No    Emotionally Abused: No    Physically Abused: No    Sexually Abused: No    Review of Systems: See HPI, otherwise negative ROS  Physical Exam: BP 125/72   Pulse 76   Temp (!) 96.2 F (35.7 C) (Temporal)   Resp 16   Ht 5' 4 (1.626 m)   Wt 106.1 kg   SpO2 100%   BMI 40.17 kg/m  General:   Alert,  pleasant and cooperative in NAD Head:  Normocephalic and atraumatic. Neck:  Supple; no masses or thyromegaly. Lungs:  Clear throughout to auscultation, normal respiratory effort.    Heart:  +S1, +S2, Regular rate and rhythm, No edema. Abdomen:  Soft, nontender and nondistended. Normal bowel sounds, without guarding, and without rebound.   Neurologic:  Alert and  oriented x4;  grossly normal neurologically.  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  Questions have been answered.  All parties agreeable.   Ruel Kung, MD  11/26/2023, 8:05 AM

## 2023-11-26 NOTE — Transfer of Care (Signed)
 Immediate Anesthesia Transfer of Care Note  Patient: OBDULIA STEIER  Procedure(s) Performed: EGD (ESOPHAGOGASTRODUODENOSCOPY)  Patient Location: PACU and Endoscopy Unit  Anesthesia Type:MAC  Level of Consciousness: awake  Airway & Oxygen Therapy: Patient Spontanous Breathing  Post-op Assessment: Report given to RN and Post -op Vital signs reviewed and stable  Post vital signs: Reviewed and stable  Last Vitals:  Vitals Value Taken Time  BP    Temp    Pulse    Resp    SpO2      Last Pain:  Vitals:   11/26/23 0718  TempSrc: Temporal  PainSc: 0-No pain         Complications: No notable events documented.

## 2023-11-26 NOTE — Op Note (Signed)
 [facility] Gastroenterology Patient Name: Dana Shaffer Procedure Date: 11/26/2023 8:01 AM MRN: [id number] Account #: [id number] Date of Birth: March 06, 1976 Admit Type: Outpatient Age: 48 Room: [facility] ENDO ROOM 4 Gender: Female Note Status: Finalized Instrument Name: Upper GI Scope [id number] Procedure:             Upper GI endoscopy Indications:           Follow-up of intestinal metaplasia Providers:             Ruel Kung MD, MD Referring MD:          Mliss FALCON. Gareth (Referring MD) Medicines:             Monitored Anesthesia Care Complications:         No immediate complications. Procedure:             Pre-Anesthesia Assessment:                        - Prior to the procedure, a History and Physical was                         performed, and patient medications, allergies and                         sensitivities were reviewed. The patient's tolerance                         of previous anesthesia was reviewed.                        - The risks and benefits of the procedure and the                         sedation options and risks were discussed with the                         patient. All questions were answered and informed                         consent was obtained.                        - ASA Grade Assessment: II - A patient with mild                         systemic disease.                        After obtaining informed consent, the endoscope was                         passed under direct vision. Throughout the procedure,                         the patient's blood pressure, pulse, and oxygen                         saturations were monitored continuously. The Endoscope  was introduced through the mouth, and advanced to the                         third part of duodenum. The upper GI endoscopy was                         accomplished with ease. The patient tolerated the                         procedure well. Findings:      The  esophagus was normal.      The examined duodenum was normal.      The entire examined stomach was normal. Biopsies were taken with a cold       forceps for histology. Impression:            - Normal esophagus.                        - Normal examined duodenum.                        - Normal stomach. Biopsied. Recommendation:        - Discharge patient to home (with escort).                        - Resume previous diet.                        - Continue present medications.                        - Await pathology results.                        - Return to my office as previously scheduled. Procedure Code(s):     --- Professional ---                        [id number], Esophagogastroduodenoscopy, flexible,                         transoral; with biopsy, single or multiple Diagnosis Code(s):     --- Professional ---                        K31.A0, Gastric intestinal metaplasia, unspecified CPT copyright 2022 American Medical Association. All rights reserved. The codes documented in this report are preliminary and upon coder review may  be revised to meet current compliance requirements. Ruel Kung, MD Ruel Kung MD, MD 11/26/2023 8:26:15 AM This report has been signed electronically. Number of Addenda: 0 Note Initiated On: 11/26/2023 8:01 AM Estimated Blood Loss:  Estimated blood loss: none.      [facility]

## 2023-11-29 LAB — SURGICAL PATHOLOGY

## 2023-12-02 ENCOUNTER — Ambulatory Visit (INDEPENDENT_AMBULATORY_CARE_PROVIDER_SITE_OTHER)

## 2023-12-02 DIAGNOSIS — E538 Deficiency of other specified B group vitamins: Secondary | ICD-10-CM

## 2023-12-02 MED ORDER — CYANOCOBALAMIN 1000 MCG/ML IJ SOLN
1000.0000 ug | Freq: Once | INTRAMUSCULAR | Status: AC
  Administered 2023-12-02: 1000 ug via INTRAMUSCULAR

## 2023-12-17 ENCOUNTER — Other Ambulatory Visit: Payer: Self-pay | Admitting: Family Medicine

## 2023-12-17 DIAGNOSIS — K769 Liver disease, unspecified: Secondary | ICD-10-CM

## 2023-12-17 DIAGNOSIS — K7689 Other specified diseases of liver: Secondary | ICD-10-CM

## 2023-12-30 ENCOUNTER — Ambulatory Visit (INDEPENDENT_AMBULATORY_CARE_PROVIDER_SITE_OTHER)

## 2023-12-30 DIAGNOSIS — E538 Deficiency of other specified B group vitamins: Secondary | ICD-10-CM | POA: Diagnosis not present

## 2023-12-30 MED ORDER — CYANOCOBALAMIN 1000 MCG/ML IJ SOLN
1000.0000 ug | Freq: Once | INTRAMUSCULAR | Status: AC
  Administered 2023-12-30: 1000 ug via INTRAMUSCULAR

## 2023-12-30 NOTE — Progress Notes (Signed)
 Patient is in office today for a nurse visit for B12 Injection. Patient Injection was given in the  Right deltoid. Patient tolerated injection well.

## 2024-01-20 NOTE — Progress Notes (Addendum)
 Today the history is gathered from: 100% - patient  0% - alone   RECORDS SUMMARY:  REFERRING PHYSICIAN: Self PRIMARY CARE PHYSICIAN:  [facility], [facility]  IMPRESSION/PLAN  Dana Shaffer is a 48 y.o. female presenting for evaluation of  DIZZINESS/ BRAIN FOG/ LIGHTHEADEDNESS/ BLURRY VISION/ NECK PAIN/ HEADACHES/ MUSCLE TWITCHING -Unchanged. -Patient with unchanged dizziness that worsens  when bending over, looking down, lifting heavy objects.  Unchanged whole body tingling and numbness in the left side of the body. Unchanged blurry vision with intermittent photophobia in bilateral eyes. -Reviewed heavy metals profile lab from 09/09/2023, normal.  -Reviewed copper  level from 09/09/2023, normal.  -Reviewed zinc level from 09/09/2023, normal.  -Order small fiber nerve biopsy to evaluate for small fiber neuropathy.  -Can consider Depakote for neuropathy FND and depression at future visit.   Medications previously tried: Abilify 5 mg daily - worsening dizziness Effexor XR 75 mg - constant nausea Meclizine - diarrhea, near syncope Clonazepam - worsening dizziness Wellbutrin XL 150 mg - worsening dizziness Nortriptyline 20 mg - worsening dizziness   Addendum 03/24/2024 Therapath biopsy results   Epidermal Nerve Fiber Density Diagnosis:  A. Left thigh, skin biopsy: Significantly reduced epidermal nerve fiber density, consistent with small fiber neuropathy.  B. Left calf, skin biopsy: Normal epidermal nerve fiber density  C. Left foot, skin biopsy: Normal epidermal nerve fiber density    Follow-up with Dr. Lane in 3-4 months.  p=4  CHIEF COMPLAINT & HPI  Dana Shaffer is a 48 y.o. female presenting for evaluation of: Chief Complaint  Patient presents with   DIZZINESS/ BRAIN FOG/ LIGHTHEADEDNESS    BLURRY VISION/ NECK PAIN/ HEADACHES   MUSCLE TWITCHING    DIZZINESS/ BRAIN FOG/ LIGHTHEADEDNESS/ BLURRY VISION/ NECK PAIN/ HEADACHES/ MUSCLE TWITCHING Patient with  unchanged symptoms. Unchanged constant dizziness that worsens with bending over, looking down, lifting heavy objects and when pressure is applied to the neck. Has at least mild dizziness constantly. Describes dizziness as being drunk. No spinning sensation or lightheadedness. Severe dizzy spells last for hours. Unchanged whole body tingling and numbness in the left side of the body. Feels as if her body is vibrating. Endorses having a flare up on numbness weeks ago. Unchanged blurry vision with intermittent photophobia in bilateral eyes. Infrequent headaches. States tinnitus when bending over as well. Unchanged neck pain. Balance has been ok, no recent falls. Some mild imbalance and gait difficulty. Does not walk with any assisted walking device. Endorses worsening severe pain in outer hips that radiated down outer thighs. Denies any weakness in lower or upper extremities.Sleep is poor. Patient with visit to [facility] neurology on 10/13/2023 for evaluation of symptoms. Was recommend to check vitamin B12 level, folic acid, vitamin E, copper  level, ANA, ESR, CRP and myeloma panel with PCP. Has not started Naltrexone due to recent labs. Did not want medication to interfere with lab results.    DATA SUMMARY: 08/11/2023 EMG LOWERS IMPRESSION: Normal study. There is no electrodiagnostic evidence of a large fiber neuropathy or myopathy on this study.   08/04/2023 EMG UPPERS IMPRESSION: Abnormal study.  There is electrodiagnostic evidence of a chronic, moderate left and mild right carpal tunnel syndrome.   04/18/2023 CT HEAD WO CONTRAST IMPRESSION:  Negative head CT.   02/01/2023 MR CERVICAL SPINE WO CONTRAST IMPRESSION:  1. Multilevel cervical spondylosis with diffuse spinal stenosis at  C3-4 through C7-T1, most pronounced at C5-6 where stenosis is  mild-to-moderate in nature.  2. Multilevel foraminal narrowing due to disc bulging and  uncovertebral disease  as above. Notable findings include moderate  to  severe left C4 foraminal stenosis, severe right with  mild-to-moderate left C5 foraminal narrowing, with mild left C6  foraminal stenosis.    11/23/2022 MR BRAIN/IAC W WO CONTRAST IMPRESSION:  1. No evidence of an acute intracranial abnormality.  2. No cerebellopontine angle or internal auditory canal mass.  3. Multifocal T2 FLAIR hyperintense signal abnormality within the  cerebral white matter, mild but greater than expected for age. These  signal changes are nonspecific, but most often secondary to chronic  small vessel ischemia.   VISIT SUMMARIES:   MEDICATIONS Current Outpatient Medications  Medication Sig Dispense Refill   cholecalciferol 1000 unit tablet Take by mouth twice a week     Compound Medication Med Name: Naltrexone 0.1mg  daily for a month then increase to 0.2mg  daily and continue that dose 60 each 1   Compound Medication Med Name: naltrexone Take 0.5 mg once a day for a month 30 each 1   levothyroxine  (SYNTHROID ) 125 MCG tablet Take 125 mcg by mouth once daily     levothyroxine  (SYNTHROID , LEVOTHROID) 100 MCG tablet Take 125 mcg by mouth once daily Take on an empty stomach with a glass of water  at least 30-60 minutes before breakfast.     omeprazole  (PRILOSEC) 40 MG DR capsule Take 1 capsule (40 mg total) by mouth once daily 90 capsule 3   ARIPiprazole (ABILIFY) 5 MG tablet Take 1 tablet (5 mg total) by mouth at bedtime (Patient not taking: Reported on 01/20/2024) 30 tablet 1   atorvastatin (LIPITOR) 10 MG tablet Take 1 tablet by mouth once daily (Patient not taking: Reported on 01/20/2024)     buPROPion (WELLBUTRIN XL) 150 MG XL tablet TAKE 1 TABLET(150 MG) BY MOUTH DAILY (Patient not taking: Reported on 01/20/2024) 30 tablet 1   calcium carbonate-vitamin D3 (OS-CAL 500+D) 500 mg-5 mcg (200 unit) tablet Take 1 tablet by mouth (Patient not taking: Reported on 01/20/2024)     cyanocobalamin  (VITAMIN B12) 1000 MCG tablet Take 1,000 mcg by mouth Taking some  days (Patient not taking: Reported on 01/20/2024)     DULoxetine (CYMBALTA) 30 MG DR capsule Take 30 mg by mouth every morning (Patient not taking: Reported on 01/20/2024)     ezetimibe (ZETIA) 10 mg tablet Take 1 tablet (10 mg total) by mouth once daily (Patient not taking: Reported on 05/31/2022) 30 tablet 2   fluticasone-umeclidinium-vilanterol (TRELEGY ELLIPTA) 200-62.5-25 mcg inhaler Inhale 1 inhalation into the lungs once daily (Patient not taking: Reported on 01/20/2024) 60 each 3   gabapentin (NEURONTIN) 100 MG capsule 3 (three) times daily as needed (Patient not taking: Reported on 01/20/2024)     metFORMIN (GLUCOPHAGE-XR) 500 MG XR tablet  (Patient not taking: Reported on 06/24/2023)     naltrexone (REVIA) 50 mg tablet Take 0.5 tablets (25 mg total) by mouth every morning (Patient not taking: Reported on 01/20/2024) 15 tablet 1   nortriptyline (PAMELOR) 10 MG capsule Start Nortriptyline (Pamelor) 10 mg nightly for one week, then increase to 20 mg nightly (Patient not taking: Reported on 01/20/2024) 60 capsule 3   predniSONE (DELTASONE) 5 MG tablet Take as directed - 6 day taper (Patient not taking: Reported on 01/20/2024) 21 tablet 0   rosuvastatin (CRESTOR) 10 MG tablet  (Patient not taking: Reported on 01/20/2024)     rosuvastatin (CRESTOR) 5 MG tablet Take 1 tablet (5 mg total) by mouth once daily (Patient not taking: Reported on 01/20/2024) 90 tablet 0   traZODone (DESYREL)  50 MG tablet Take 50 mg by mouth at bedtime (Patient not taking: Reported on 01/20/2024)     TRUEPLUS PEN NEEDLE 31 gauge x 3/16 needle  (Patient not taking: Reported on 01/20/2024)     venlafaxine (EFFEXOR-XR) 75 MG XR capsule Take 75 mg at night for two weeks then increase to 150 mg at night and continue that dose (Patient not taking: Reported on 01/20/2024) 60 capsule 1   No current facility-administered medications for this visit.    ALLERGIES Allergies  Allergen Reactions   Gabapentin Rash    Comes and  goes. Been on medication for 3 weeks now.     EXAM   Vitals:   01/20/24 0954  PainSc:   5  PainLoc: Generalized     There is no height or weight on file to calculate BMI.  We were not able to do thorough physical exam during this televisit. Neurological exam is a crucial part of patient evaluation and lack of it can lead to misdiagnosis or missed diagnosis. If patient has concerns, they should consider making in person appointment with the provider. Provider should not be liable for consequences of lack of in person exam.   GENERAL: Pleasant female, in nad. Normocephalic and atraumatic.  MUSCULOSKELETAL: Bulk - Normal Tone - Normal Pronator Drift - Absent bilaterally. Ambulation - Gait and station is did not test.  Romberg - negative  R/L 5/5    Shoulder abduction (deltoid/supraspinatus, axillary/suprascapular n, C5) 5/5    Elbow flexion (biceps brachii, musculoskeletal n, C5-6) 5/5    Elbow extension (triceps, radial n, C7) 5/5    Finger adduction (interossei, ulnar n, T1)  5/5    Hip flexion (iliopsoas, L1/L2) 5/5    Knee flexion (hamstrings, sciatic n, L5/S1)  5/5    Knee extension (quadriceps, femoral n, L3/4) 5/5    Ankle dorsiflexion (tibialis anterior, deep fibular n, L4/5) 5/5    Ankle plantarflexion (gastroc, tibial n, S1)   NEUROLOGICAL: MENTAL STATUS: Patient is oriented to person, place and time.   Long-term memory is intact.   Attention span and concentration are intact.   Naming and repetition are intact. Comprehension is intact.   Expressive speech is intact.   Patient's fund of knowledge is within normal limits for educational level.  CRANIAL NERVES: Visual acuity and visual fields are intact         Extraocular muscles are intact                        Facial sensation is intact bilaterally                Facial strength is intact bilaterally                   Hearing is intact bilaterally                              Palate elevates midline,  normal phonation     Shoulder shrug strength is intact                    Tongue protrudes midline                       SENSATION: Pain and temperature (spinothalamic tracts) decreased sensation in the left arm. Position and vibration (dorsal columns) is normal.  REFLEXES: R/L 2+/2+    Biceps 2+/2+  Brachioradialis  2+/2+    Patellar 2+/2+    Achilles  COORDINATION/CEREBELLAR: Finger to nose testing is normal.      Mild movement tremor  PAST MEDICAL HISTORY Past Medical History:  Diagnosis Date   Anemia 12/26/16   Iron deficiency anemia due to chronic blood loss   Anxiety    Atherosclerosis of abdominal aorta ()    Chronic abdominal pain, unspecified    Erosive gastritis    Hyperlipidemia    Hypertension    Obesity    Sleep apnea    Do not remember date of sleep study   Thyroid disease    on synthroid     PAST SURGICAL HISTORY Past Surgical History:  Procedure Laterality Date   COLONOSCOPY WITH PROPOFOL    07/01/2023   EGD @ [facility]  11/26/2023   gastric intestinal metaplasia/Mild Gastritis/Repeat 68yrs/KAnna   CHOLECYSTECTOMY     GALLBLADDER REMOVED      1999   HYSTERECTOMY     PELVIC LAPAROSCOPY     remove adhesions   THYROIDECTOMY FOR REMOVAL REMAINING TISSUE     REMOVED AT AGE OF 19    FAMILY HISTORY Family History  Problem Relation Name Age of Onset   Asthma Mother Devere        She does not have severe only mild   Allergies Mother Devere    Heart failure Father     High blood pressure (Hypertension) Father     Cancer Maternal Grandmother Jennie Fish        Vocal chord    SOCIAL HISTORY  Social History   Tobacco Use   Smoking status: Every Day    Current packs/day: 1.00    Average packs/day: 1 pack/day for 30.0 years (30.0 ttl pk-yrs)    Types: Cigarettes    Passive exposure: Past   Smokeless tobacco: Never   Tobacco comments:    Did not smoke during pregnancy; also had quit for 2 years in early 20's  Vaping Use    Vaping status: Never Used  Substance Use Topics   Alcohol use: Not Currently   Drug use: No     REVIEW OF SYSTEMS:  13 system ROS form was given to the patient to complete and I have reviewed it.  The form was sent for scan to the patient's EHR.  Pertinent positives and negatives are mentioned above in the HPI and all other systems are negative.   DATA  I have personally reviewed all of the data outlined below both prior to the appointment and during the appointment with the patient as appropriate.  Appointment on 12/10/2023  Component Date Value Ref Range Status   Alpha Fetoprotein (AFP) 12/10/2023 2.9  <9.0 ng/mL Final  Appointment on 09/09/2023  Component Date Value Ref Range Status   Lead, Blood - [facility] 09/09/2023 <1.0  0.0 - 3.4 ug/dL Final   Arsenic Blood - [facility] 09/09/2023 1  0 - 9 ug/L Final   Mercury Blood - [facility] 09/09/2023 <1.0  0.0 - 14.9 ug/L Final   Copper  - [facility] 09/09/2023 116  80 - 158 ug/dL Final   Zinc - [facility] 09/09/2023 66  44 - 115 ug/dL Final    No follow-ups on file.  Payor: [employer] / Plan: SUREST / Product Type: POS /   This note is partially written by Roe Mater, in the presence of and acting as the scribe of Dr. Arthea Farrow.    This video encounter was conducted with the patient's (or proxy's) verbal consent via  secure, interactive audio and video telecommunications while in clinic/office/hospital.  The patient (or proxy) was instructed to have this encounter in a suitably private space and to only have persons present to whom they give permission to participate. In addition, patient identity was confirmed by use of name plus an additional identifier.  This visit was coded based on medical decision making (MDM).   I have reviewed, edited and added to the note as needed to reflect my best personal medical judgment.    Dr. Arthea Farrow, MD [facility] [place], [place] Ph:  [phone] Fax:   [phone]

## 2024-02-04 ENCOUNTER — Encounter: Payer: Self-pay | Admitting: Nurse Practitioner

## 2024-02-04 ENCOUNTER — Ambulatory Visit
Admission: RE | Admit: 2024-02-04 | Discharge: 2024-02-04 | Disposition: A | Discharge status: Home / Self Care | Attending: Nurse Practitioner | Admitting: Nurse Practitioner

## 2024-02-04 ENCOUNTER — Ambulatory Visit (INDEPENDENT_AMBULATORY_CARE_PROVIDER_SITE_OTHER): Admitting: Nurse Practitioner

## 2024-02-04 ENCOUNTER — Ambulatory Visit
Admission: RE | Admit: 2024-02-04 | Discharge: 2024-02-04 | Disposition: A | Discharge status: Home / Self Care | Source: Ambulatory Visit | Attending: Nurse Practitioner | Admitting: Nurse Practitioner

## 2024-02-04 VITALS — BP 104/64 | HR 90 | Resp 16 | Ht 64.0 in | Wt 227.4 lb

## 2024-02-04 DIAGNOSIS — I700 Atherosclerosis of aorta: Secondary | ICD-10-CM | POA: Diagnosis not present

## 2024-02-04 DIAGNOSIS — R42 Dizziness and giddiness: Secondary | ICD-10-CM

## 2024-02-04 DIAGNOSIS — E039 Hypothyroidism, unspecified: Secondary | ICD-10-CM | POA: Insufficient documentation

## 2024-02-04 DIAGNOSIS — E782 Mixed hyperlipidemia: Secondary | ICD-10-CM | POA: Insufficient documentation

## 2024-02-04 DIAGNOSIS — M25551 Pain in right hip: Secondary | ICD-10-CM

## 2024-02-04 DIAGNOSIS — R202 Paresthesia of skin: Secondary | ICD-10-CM | POA: Diagnosis not present

## 2024-02-04 DIAGNOSIS — R4189 Other symptoms and signs involving cognitive functions and awareness: Secondary | ICD-10-CM

## 2024-02-04 NOTE — Progress Notes (Signed)
 BP 104/64   Pulse 90   Resp 16   Ht 5' 4 (1.626 m)   Wt 227 lb 6.4 oz (103.1 kg)   SpO2 100%   BMI 39.03 kg/m    Subjective:    Patient ID: Dana Shaffer, female    DOB: Apr 21, 1975, 48 y.o.   MRN: [id number]  HPI: Dana Shaffer is a 48 y.o. female  Chief Complaint  Patient presents with   Medical Management of Chronic Issues   Discussed the use of AI scribe software for clinical note transcription with the patient, who gave verbal consent to proceed.  History of Present Illness Dana Shaffer is a 49 year old female with chronic neurological symptoms who presents for a three-month follow-up.  Chronic neurological symptoms - Chronic paresthesia, dizziness, brain fog, and tremors - Medications generally exacerbate dizziness, except for current regimen - Scheduled for small fiber neuropathy biopsy on December 1st - Undergoing various tests, including B12 level check due to significant drop in levels over past six months - Receives monthly B12 injections; considering increasing frequency due to absorption issues likely related to PPI use  Thyroid dysfunction and adrenal concerns - History of hypothyroidism, post-thyroidectomy - Currently taking levothyroxine 125 micrograms daily - Expressed concerns about potential adrenal insufficiency - Recent cortisol testing affected by stress, with a level of 13 (previously 6)  Hepatic lesion - Right hepatic hemangioma, 11 mm hypervascular lesion in the dome of the right hepatic lobe identified on MRI - Follow-up MRI scheduled for May 19, 2024  Bilateral hip pain and functional impairment - Bilateral hip pain, initially right-sided with radiation down the lateral aspect of the leg, now involving the left hip - Significant discomfort and difficulty walking - Pain does not resemble typical nerve or muscle pain - Increased stiffness and pain upon standing after prolonged sitting - Significant pain with lateral leg  lifting         02/04/2024    7:54 AM 10/28/2023    8:15 AM  Depression screen PHQ 2/9  Decreased Interest 0 0  Down, Depressed, Hopeless 0 1  PHQ - 2 Score 0 1  Altered sleeping 0 0  Tired, decreased energy 0 0  Change in appetite 0 0  Feeling bad or failure about yourself  0 0  Trouble concentrating 0 0  Moving slowly or fidgety/restless 0 0  Suicidal thoughts 0 0  PHQ-9 Score 0 1  Difficult doing work/chores Not difficult at all Not difficult at all    Relevant past medical, surgical, family and social history reviewed and updated as indicated. Interim medical history since our last visit reviewed. Allergies and medications reviewed and updated.  Review of Systems  Ten systems reviewed and is negative except as mentioned in HPI      Objective:      BP 104/64   Pulse 90   Resp 16   Ht 5' 4 (1.626 m)   Wt 227 lb 6.4 oz (103.1 kg)   SpO2 100%   BMI 39.03 kg/m    Wt Readings from Last 3 Encounters:  02/04/24 227 lb 6.4 oz (103.1 kg)  11/26/23 234 lb (106.1 kg)  11/03/23 244 lb (110.7 kg)    Physical Exam GENERAL: Alert, cooperative, well developed, no acute distress HEENT: Normocephalic, normal oropharynx, moist mucous membranes CHEST: Clear to auscultation bilaterally, No wheezes, rhonchi, or crackles CARDIOVASCULAR: Normal heart rate and rhythm, S1 and S2 normal without murmurs ABDOMEN: Soft, non-tender, non-distended, without organomegaly,  Normal bowel sounds EXTREMITIES: No cyanosis or edema NEUROLOGICAL: Cranial nerves grossly intact, Moves all extremities without gross motor or sensory deficit  Results for orders placed or performed during the hospital encounter of 11/26/23  Surgical pathology   Collection Time: 11/26/23 12:00 AM  Result Value Ref Range   SURGICAL PATHOLOGY      SURGICAL PATHOLOGY [facility] [address] [place], [place] [address] Telephone [phone] or [phone] Fax [phone]  REPORT  OF SURGICAL PATHOLOGY   Accession #: [id number] Patient Name: Dana Shaffer, Dana Shaffer Visit # : [id number]  MRN: [id number] Physician: Therisa Bi DOB/Age 02-19-1976 (Age: 34) Gender: F Collected Date: 11/26/2023 Received Date: 11/26/2023  FINAL DIAGNOSIS       1. Stomach, biopsy, cbx distal antrum less curvature :       - GASTRIC ANTRAL MUCOSA WITH NONSPECIFIC REACTIVE GASTROPATHY AND FOCAL      INTESTINAL METAPLASIA      - NEGATIVE FOR DYSPLASIA      - HELICOBACTER PYLORI-LIKE ORGANISMS ARE NOT IDENTIFIED ON ROUTINE H&E STAIN       2. 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 Stomach,  biopsy, cbx incisura angularis less curvature :       - GASTRIC ANTRAL MUCOSA WITH MILD NONSPECIFIC REACTIVE GASTROPATHY      - HELICOBACTER PYLORI-LIKE ORGANISMS ARE NOT IDENTIFIED ON ROUTINE H&E STAIN       4. Stomach, biopsy, cbx proximal corpus less curvature :       - GASTRIC OXYNTIC MUCOSA WITH PARIETAL CELL HYPERPLASIA AS CAN BE SEEN IN      HYPERGASTRINEMIC STATES SUCH AS PPI THERAPY.      - HELICOBACTER PYLORI-LIKE ORGANISMS ARE NOT IDENTIFIED ON ROUTINE H&E STAIN       5. Stomach, biopsy, cbx proximal corpus great curvature :       - GASTRIC OXYNTIC MUCOSA WITH PARIETAL CELL HYPERPLASIA AS CAN BE SEEN IN      HYPERGASTRINEMIC STATES SUCH AS PPI THERAPY.      - HELICOBACTER PYLORI-LIKE ORGANISMS ARE NOT IDENTIFIED ON ROUTINE H&E STAIN       ELECTRONIC SIGNATURE : Rebbecca Md, Insurance account manager, Electronic Signature  MICROSCOPIC DESCRIPTION  CASE COMMENTS STAINS USED IN DIAGNOSIS: H&E H&E H&E H&E H&E    CLINICAL HISTORY  SPECIMEN(S) OBTAINE D 1. Stomach, biopsy, Cbx Distal Antrum Less Curvature 2. Stomach, biopsy, Cbx Distal Antrum Great Curvature 3.  Stomach, biopsy, Cbx Incisura Angularis Less Curvature 4. Stomach, biopsy, Cbx Proximal Corpus Less Curvature 5. Stomach, biopsy, Cbx Proximal Corpus Great Curvature  SPECIMEN COMMENTS: SPECIMEN CLINICAL INFORMATION: 1. Normal    Gross Description 1. Received in formalin are tan, soft tissue fragments that are submitted in toto.Number:  two  Size:  0.3 cm, 1 block. 2. Received in formalin are tan, soft tissue fragments that are submitted in toto.Number:  three,  Size:  0.4 cm, 1 block 3. Received in formalin are tan, soft tissue fragments that are submitted in toto.Number:  three,  Size:  0.4 cm, 1 block 4. Received in formalin are tan, soft tissue fragments that are submitted in toto.Number:  three,  Size:  0.1-0.5 cm, 1 block 5. Received in formalin  are tan, soft tissue fragments that are submitted in toto.Number:  three,  Size:  0.4 cm, 1 block.mb 8- 15-25        Report signed out from the following location(s) [place] [facility] [address] [place], [place] [address] CLIA #: [id number]  [facility] [address] [place], [place] [address] CLIA #: [id number]           Assessment & Plan:   Problem List Items Addressed This Visit       Cardiovascular and Mediastinum   Aortic atherosclerosis     Endocrine   Hypothyroidism     Other   Tingling   Relevant Orders   Ambulatory referral to Hematology / Oncology   Dizzinesses   Relevant Orders   Ambulatory referral to Hematology / Oncology   Mixed hyperlipidemia - Primary   Other Visit Diagnoses       Brain fog       Relevant Orders   Ambulatory referral to Hematology / Oncology     Right hip pain       Relevant Orders   DG HIP UNILAT W OR W/O PELVIS 2-3 VIEWS RIGHT        Assessment and Plan Assessment & Plan Chronic bilateral hip pain and stiffness Chronic pain and stiffness in the right hip, now also affecting the left hip, with pain radiating down the side of  the legs. The pain is exacerbated by walking and certain movements, such as lifting the leg. Differential diagnosis includes musculoskeletal issues such as bursitis or joint compression. - Order x-ray of the hips to evaluate for joint compression or other abnormalities. - Advise to visit [facility] for x-ray without appointment.  Chronic neurological symptoms (paresthesia, dizziness, brain fog, tremors) Persistent neurological symptoms including paresthesia, dizziness, brain fog, and tremors. Neurology is involved in the management, and a small fiber neuropathy biopsy is scheduled for December 1st. There is a concern about medication side effects exacerbating dizziness. Functional medicine consultation is ongoing, with testing for potential underlying causes such as MCAS and bacterial imbalances. - Proceed with small fiber neuropathy biopsy on December 1st. - Consider referral to hematology for further evaluation of neurological symptoms. - Monitor results from functional medicine testing.  Hypothyroidism, post-thyroidectomy Hypothyroidism post-thyroidectomy, managed with levothyroxine 125 mcg daily.  Vitamin B12 deficiency, on monthly injections Vitamin B12 deficiency managed with monthly injections. Concerns about absorption due to PPI use and gastritis. Discussed the possibility of more frequent injections to build up levels, Functional medicine may offer alternative options.  Right hepatic hemangioma, under surveillance Right hepatic hemangioma, 11 mm, hypervascular lesion in the dome of the right hepatic lobe. Under surveillance with a follow-up MRI scheduled for May 19, 2024.  Hyperlipidemia/aortic atherosclerosis Lipid Panel     Component Value Date/Time   CHOL 252 (H) 10/28/2023 0917   TRIG 61 10/28/2023 0917   HDL 36 (L) 10/28/2023 0917   CHOLHDL 7.0 (H) 10/28/2023 0917   LDLCALC 200 (H) 10/28/2023 9082   Not taking statin.  Was previously prescribed  but states she does not tolerate most medications.  The 10-year ASCVD risk score (Arnett DK, et al., 2019) is: 5.3%   Values used to calculate the score:     Age: 72 years     Clincally relevant sex: Female     Is Non-Hispanic African American: No     Diabetic: No     Tobacco smoker: Yes     Systolic Blood  Pressure: 104 mmHg     Is BP treated: No     HDL Cholesterol: 36 mg/dL     Total Cholesterol: 252 mg/dL    Patient was concerned that she might have multiple myeloma and requested a microglobulin test.  Test ordered and was in normal range. She would like further testing, referral placed to hematology.      Follow up plan: Return in about 6 months (around 08/04/2024) for follow up.

## 2024-02-07 ENCOUNTER — Ambulatory Visit: Payer: Self-pay | Admitting: Nurse Practitioner

## 2024-02-09 ENCOUNTER — Ambulatory Visit (INDEPENDENT_AMBULATORY_CARE_PROVIDER_SITE_OTHER)

## 2024-02-09 DIAGNOSIS — E538 Deficiency of other specified B group vitamins: Secondary | ICD-10-CM

## 2024-02-09 MED ORDER — CYANOCOBALAMIN 1000 MCG/ML IJ SOLN
1000.0000 ug | Freq: Once | INTRAMUSCULAR | Status: AC
  Administered 2024-02-09: 1000 ug via INTRAMUSCULAR

## 2024-02-09 NOTE — Progress Notes (Signed)
 [facility] (Cardiology)  [employer]   [address]  [place], [place] [address]   Clinic Telephone [phone] - Clinic Fax:  [phone]   Hospital Telephone [phone]      Name: Dana Shaffer  Date of Birth: 01-06-1976  Medical Record Number: [id number]    Reason for visit:  1. Palpitations  2. Chest pain    Thursday, February 10, 2024    Dear Dana Shaffer, [name], FNP    I had the pleasure of seeing Dana Shaffer in the [facility] Cardiovascular Medicine clinic in follow-up of her palpitations and chest pain syndrome. She was previously seen by Dr Olene but this represents her first visit with me.     Ms. Dana Shaffer is a 48 y.o. female with history of familial hypercholesterolemia, hypothyroidism, iron deficiency anemia, anxiety, and palpitations. Cardiac work-up was negative for ischemia (NM Spect 01/2023, most consistent with artifact at the apex). Zio patch notable for rare SVT/possible atrial tachycardia (episodes comprise <1% burden). Carotid dopplers did not show stenosis or plaque burden (02/2023). An echocardiogram in 08/2023 demonstrated normal ventricular function and the absence of significant valvular pathology.  At her last cardiology visit (10/22/2023), attention was focused on issues of risk prevention.     Today, [name] reports the following additional information and history:  She continues to complain of similar chronic symptoms (temperature sensitivity - feeling cold, low blood pressure, palpitations)  In addition, she reports multi-system including; full body paresthesias, dizziness, neuropathy, and unintentional weight   She is not taking cholesterol lowering medications because she thinks it makes her dizzy  She continues to smoke cigarettes (1 ppd)  She has started to see a functional medicine provider. She has previously seen other specialists (endocrinologist, neurologist, rheumatology). Cardiovascular review of systems is otherwise remarkable for the absence of paroxysmal nocturnal dyspnea, orthopnea, worsened peripheral edema, palpitations, syncope, or sign/symptoms consistent with transient ischemic attack, cerebrovascular accident, claudication or critical limb ischemia.  12 system review of symptoms is otherwise negative, noted above or is documented in the signed clinic questionnaire (see separate documentation).    ALLERGIES: Allergies[1]   MEDICATIONS:  Medications Ordered Prior to Encounter[2]     PAST MEDICAL HISTORY:  1. Dyslipidemia  2. Palpitations  3. Anxiety  4. Elevated BMI  5. Hypothyroidism  6. Iron deficiency anemia    SOCIAL HISTORY: Short Social History[3] Lives in [place], [place]. Works for [employer] - works for home. She continues to smoke cigarettes (1 ppd).   FAMILY HISTORY: Family History[4]    PHYSICAL EXAMINATION:  Vitals:    02/10/24 1327   BP: 139/62   Pulse: 81   SpO2: 100%   GENERAL: 48 y.o. in no acute distress. HEENT: Normocephalic, atraumatic, extraocular movements are intact. Oral MM pink and moist. NECK: 2+ Carotid pulses, No bruits. CHEST: Clear to auscultation bilaterally. CARDIOVASCULAR:  Regular rate and rhythm, normal S1+S2, no murmurs rubs or gallops. ABDOMEN: Soft, normoactive bowel sounds. No bruits. EXTREMITIES:  No edema. No lesions, ulcerations, or rashes.  VASCULAR: 2+ carotid, radial, brachial, femoral, popliteal and PT/DP pulses bilaterally.  NEUROLOGICAL: CN 2-12 intact.  Gait normal.  PSCYH: Affect normal and appropriate.    DIAGNOSTIC DATA: I personally reviewed all available records, data and images prior to consultation.  EKG (02/10/2024): NSR at 76 bpm, PR , QRS76, no acute ST-T changes    Zio Patch, 12/2022:  Patient had a min HR of 40 bpm, max HR of 162 bpm,  and avg HR of 71  bpm. Predominant underlying rhythm was Sinus Rhythm. 3  Supraventricular Tachycardia runs occurred, the run with the fastest interval lasting 7 beats with a max rate of 162 bpm (avg 131 bpm); the  run with the fastest interval was also the longest. Some episodes of Supraventricular Tachycardia may be possible Atrial Tachycardia with  variable block. Isolated SVEs were rare (<1.0%), SVE Couplets were rare (<1.0%), and SVE Triplets were rare (<1.0%). Isolated VEs were rare  (<1.0%), and no VE Couplets or VE Triplets were present.    Non-Invasive Evaluation(s):  NM SPECT Stress, 01/14/2023:  Impressions:  - There is a very small, subtle, partially reversible perfusion defect involving the apical segment. This is consistent with probable artifact but, cannot rule out subtle ischemia.  - Left ventricular systolic function is normal. Post stress the ejection fraction is > 60%.  - Coronary calcifications are noted  - Sensitivity and specificity of this test are reduced by the noted increased BMI  - Status post cholecystectomy    Echocardiogram (08/2023):  1. The left ventricle is mildly dilated in size with normal wall thickness.   2. The left ventricular systolic function is normal, LVEF is visually estimated at > 55%.   3. The right ventricle is normal in size, with normal systolic function.   4. IVC size and inspiratory change suggest mildly elevated right atrial pressure. (5-10 mmHg). Left Ventricle  -   The left ventricle is mildly dilated in size with normal wall thickness. The left ventricular systolic function is normal, LVEF is visually estimated at > 55%. LV global longitudinal strain: 20.0 %. Right Ventricle -   The right ventricle is normal in size, with normal systolic function. Aortic Valve -   The aortic valve is probably trileaflet with normal appearing leaflets with probably normal excursion. There is no significant aortic regurgitation. There is no evidence of a significant transvalvular gradient. Mitral Valve -  The mitral valve leaflets are normal with normal leaflet mobility. There is no significant mitral valve regurgitation. Tricuspid Valve -   The tricuspid valve leaflets are poorly visualized but probably normal, with probably normal leaflet mobility. There is no significant tricuspid regurgitation. Inferior Vena Cava -  IVC size and inspiratory change suggest mildly elevated right atrial pressure. (5-10 mmHg). Pericardium/Pleural -  There is no pericardial effusion.    Lab Results   Component Value Date    WBC 10.2 07/16/2023    HGB 13.9 07/16/2023    HCT 40.2 07/16/2023    PLT 331 07/16/2023    CHOL 240 (H) 12/04/2022    TRIG 75 12/04/2022    HDL 37 (L) 12/04/2022    ALT 12 07/16/2023    AST 13 07/16/2023    NA 143 07/16/2023    K 4.0 07/16/2023    CL 102 07/16/2023    CREATININE 0.71 07/16/2023    BUN 21 07/16/2023    CO2 29.2 07/16/2023    TSH 1.511 07/16/2023     Lab Results   Component Value Date    A1C 5.6 09/15/2016      Hemoglobin A1C   Date Value Ref Range Status   09/15/2016 5.6 4.8 - 5.6 % Final      Lipid panel (10/2023): TC 252, Trig 61, HDL 36, LDL 200    IMPRESSION AND PLAN: Ms. Dana Shaffer is a 48 y.o. female with history of familial hypercholesterolemia, hypothyroidism, iron deficiency anemia, anxiety, and palpitations. Cardiac work-up was negative for ischemia (  NM Spect 01/2023, most consistent with artifact at the apex). Zio patch notable for rare SVT/possible atrial tachycardia (episodes comprise <1% burden). Carotid dopplers did not show stenosis or plaque burden (02/2023). An echocardiogram in 08/2023 demonstrated normal ventricular function and the absence of significant valvular pathology.  At her last cardiology visit (10/22/2023), attention was focused on issues of risk prevention.   NYHA Class I  CCS 0    RECOMMENDATIONS:  CVD risk preventions:  Blood pressure goal <130/80 mmHg. At goal. Reports low BP at home (~90s/50s) but not hypotensive at home.  Not taking statin due to perceived side-effects. But, she will restart rosuvastatin  at 2.5 mg every day or every other day. If rosuvastatin  not tolerated, can consider pravastatin. Tobacco cessation recommended, but patient is not currently ready to quit.  Advised on the importance of cardiovascular healthy exercise, diet and lifestyle  Impaired fasting glucose - follow-up with Dana Shaffer [name], FNP     2. I do not have a cardiac explanation for her constellation of symptoms. We discussed the logical manifestations of coronary artery disease, heart failure, valvular heart disease, and arrhythmia. Given her prior testing (all reassuring) and nature of chronic symptoms, she agreed that is unlikely that a cardiac condition explains her symptoms.     3. While we did not schedule a follow-up appointment she will return to clinic when her other symptoms have been addressed and she can better focus on CVD risk prevention (smoking cessation, lipid lowering, etc...).     Greater than 25 minutes was spent today on direct and indirect patient care, chart review and documentation.      Thank you very much for allowing me to participate in Ms. Kadel's care.      Best regards,    Maude Rocks, MD, MPH  Interventional Cardiology  Professor, Cardiovascular Medicine  [employer]  [place], [place] [address]           [1] No Known Allergies  [2]   Current Outpatient Medications on File Prior to Visit   Medication Sig Dispense Refill    calcium-vitamin D 500 mg-5 mcg (200 unit) per tablet Take 1 tablet by mouth Three (3) times a day with a meal.      levothyroxine  (SYNTHROID ) 88 MCG tablet Take 1 tablet (88 mcg total) by mouth daily. 30 tablet 11    liothyronine  (CYTOMEL) 5 MCG tablet Take 1 tablet (5 mcg) in the morning and 1/2 tablet (2.5 mcg) in the evening. 50 tablet 11    needles, pharmacy compound 20 x 1  Ndle Med Name: naltrexone Take 0.5 mg once a day for a month      omeprazole (PRILOSEC) 40 MG capsule Take 1 capsule (40 mg total) by mouth daily.       No current facility-administered medications on file prior to visit.   [3]   Social History  Tobacco Use Smoking status: Every Day     Current packs/day: 1.00     Average packs/day: 1 pack/day for 20.0 years (20.0 ttl pk-yrs)     Types: Cigarettes    Smokeless tobacco: Never    Tobacco comments:     just picked up rx for nicotine  patch   Substance Use Topics    Alcohol use: Yes     Alcohol/week: 0.0 standard drinks of alcohol     Comment: rarely    Drug use: No   [4]   Family History  Problem Relation Age of Onset  Heart failure Father     Hyperlipidemia Father         Cholesterol over 500    Heart attack Father         Age 53    Heart failure Paternal Uncle     Hyperlipidemia Paternal Uncle         Cholesterol over 1000    Cancer Maternal Grandmother         Vocal cord    Stroke Maternal Grandmother         Stroke after radiation treatment for cancer    Cancer Maternal Grandfather         Skin cancer that spread    Heart attack Paternal Uncle         Age 21    Heart attack Paternal Grandfather         Age 72    Hyperlipidemia Paternal Grandfather     Colorectal Cancer Neg Hx     Inflammatory bowel disease Neg Hx     Breast cancer Neg Hx     Diabetes Neg Hx     Ovarian cancer Neg Hx     Venous thrombosis Neg Hx

## 2024-02-10 ENCOUNTER — Other Ambulatory Visit: Payer: Self-pay | Admitting: Nurse Practitioner

## 2024-02-10 MED ORDER — LEVOTHYROXINE SODIUM 125 MCG PO TABS: 125.0000 ug | ORAL_TABLET | Freq: Every day | ORAL | 1 refills | Status: AC

## 2024-02-18 ENCOUNTER — Encounter: Payer: Self-pay | Admitting: Oncology

## 2024-02-18 ENCOUNTER — Inpatient Hospital Stay

## 2024-02-18 ENCOUNTER — Inpatient Hospital Stay: Attending: Oncology | Admitting: Oncology

## 2024-02-18 VITALS — BP 126/62 | HR 70 | Temp 98.0°F | Resp 18 | Wt 227.2 lb

## 2024-02-18 DIAGNOSIS — G629 Polyneuropathy, unspecified: Secondary | ICD-10-CM

## 2024-02-18 DIAGNOSIS — D509 Iron deficiency anemia, unspecified: Secondary | ICD-10-CM | POA: Insufficient documentation

## 2024-02-18 DIAGNOSIS — K2970 Gastritis, unspecified, without bleeding: Secondary | ICD-10-CM | POA: Diagnosis not present

## 2024-02-18 DIAGNOSIS — R4189 Other symptoms and signs involving cognitive functions and awareness: Secondary | ICD-10-CM | POA: Insufficient documentation

## 2024-02-18 DIAGNOSIS — R42 Dizziness and giddiness: Secondary | ICD-10-CM | POA: Insufficient documentation

## 2024-02-18 LAB — CBC WITH DIFFERENTIAL/PLATELET
Abs Immature Granulocytes: 0.01 K/uL (ref 0.00–0.07)
Basophils Absolute: 0 K/uL (ref 0.0–0.1)
Basophils Relative: 1 %
Eosinophils Absolute: 0.2 K/uL (ref 0.0–0.5)
Eosinophils Relative: 3 %
HCT: 43.9 % (ref 36.0–46.0)
Hemoglobin: 14.7 g/dL (ref 12.0–15.0)
Immature Granulocytes: 0 %
Lymphocytes Relative: 28 %
Lymphs Abs: 1.9 K/uL (ref 0.7–4.0)
MCH: 28.3 pg (ref 26.0–34.0)
MCHC: 33.5 g/dL (ref 30.0–36.0)
MCV: 84.4 fL (ref 80.0–100.0)
Monocytes Absolute: 0.5 K/uL (ref 0.1–1.0)
Monocytes Relative: 7 %
Neutro Abs: 4.1 K/uL (ref 1.7–7.7)
Neutrophils Relative %: 61 %
Platelets: 258 K/uL (ref 150–400)
RBC: 5.2 MIL/uL — ABNORMAL HIGH (ref 3.87–5.11)
RDW: 13.2 % (ref 11.5–15.5)
WBC: 6.8 K/uL (ref 4.0–10.5)
nRBC: 0 % (ref 0.0–0.2)

## 2024-02-18 LAB — IRON AND TIBC
Iron: 75 ug/dL (ref 28–170)
Saturation Ratios: 25 % (ref 10.4–31.8)
TIBC: 307 ug/dL (ref 250–450)
UIBC: 232 ug/dL

## 2024-02-18 LAB — FERRITIN: Ferritin: 123 ng/mL (ref 11–307)

## 2024-02-18 NOTE — Assessment & Plan Note (Addendum)
 Chronic dizziness and polyneuropathy for two years with symptoms suggestive of multiple myeloma. Previous EMG normal. Elevated inflammation parameters noted His neurologist has recommend protein electrophoresis which has not been done yet.   I will place the order. Myeloma panel, light chain ratio and 24 hour UPEP Most of her symptoms appear to be related to neurology. I encourage patient to follow up with neurology and PCP for further evaluation.  If there is monoclonal gammopathy is found, will arrange her to follow up with me.

## 2024-02-18 NOTE — Assessment & Plan Note (Signed)
 She reports a history of iron deficiency anemia.  Check cbc iron tibc ferritin

## 2024-02-18 NOTE — Progress Notes (Signed)
 Hematology/Oncology Consult note [phone] [phone]        REFERRING PROVIDER: Gareth Mliss FALCON, FNP   CHIEF COMPLAINTS/REASON FOR VISIT:  Evaluation of dizziness, numbness tingling brain fog   ASSESSMENT & PLAN:   Neuropathy Chronic dizziness and polyneuropathy for two years with symptoms suggestive of multiple myeloma. Previous EMG normal. Elevated inflammation parameters noted His neurologist has recommend protein electrophoresis which has not been done yet.   I will place the order. Myeloma panel, light chain ratio and 24 hour UPEP Most of her symptoms appear to be related to neurology. I encourage patient to follow up with neurology and PCP for further evaluation.  If there is monoclonal gammopathy is found, will arrange her to follow up with me.   Brain fog She reports a history of iron deficiency anemia.  Check cbc iron tibc ferritin    Orders Placed This Encounter  Procedures   Ferritin    Standing Status:   Future    Number of Occurrences:   1    Expected Date:   02/18/2024    Expiration Date:   05/18/2024   Iron and TIBC    Standing Status:   Future    Number of Occurrences:   1    Expected Date:   02/18/2024    Expiration Date:   05/18/2024   CBC with Differential/Platelet    Standing Status:   Future    Number of Occurrences:   1    Expected Date:   02/18/2024    Expiration Date:   05/18/2024   Multiple Myeloma Panel (SPEP&IFE w/QIG)    Standing Status:   Future    Number of Occurrences:   1    Expected Date:   02/18/2024    Expiration Date:   05/18/2024   Kappa/lambda light chains    Standing Status:   Future    Number of Occurrences:   1    Expected Date:   02/18/2024    Expiration Date:   05/18/2024   IFE+PROTEIN ELECTRO, 24-HR UR    Standing Status:   Future    Expected Date:   02/18/2024    Expiration Date:   05/18/2024    All questions were answered. The patient knows to call the clinic with any problems, questions or  concerns.  Zelphia Cap, MD, PhD [facility] Hematology Oncology 02/18/2024   HISTORY OF PRESENTING ILLNESS:   Dana Shaffer is a  48 y.o.  female with PMH listed below was seen in consultation at the request of  Gareth Mliss FALCON, FNP  for evaluation of chronic dizziness, tingling, and neuropathy symptoms  Discussed the use of AI scribe software for clinical note transcription with the patient, who gave verbal consent to proceed.    For almost two years, she has experienced chronic dizziness that persists 24/7, along with feeling freezing cold all the time. Her blood pressure has been lower than usual, and she has unintentionally lost about 100 pounds. Consuming carbohydrates exacerbates her dizziness, leading her to reduce carb intake, although she continues to lose weight despite consuming a high-calorie diet.  She reports sudden onset of full-body neuropathy symptoms, including tingling from head to toe, in her mouth, and shooting pains described as 'rubber bands popping' and 'pop rocks in my mouth.' The left side of her body went numb, which improved slightly over time but did not resolve completely. The neuropathy symptoms fluctuate, with flare-ups followed by slight improvement.  She has been diagnosed with  gastritis without a known cause, as she does not take medications like ibuprofen  and tested negative for H. pylori. She experiences increasing weakness and is easily injured, with sharp pains occurring with minimal activity, such as stepping or moving her shoulder.  She has been receiving B12 injections for about three months, as her B12 was below 400. Her last B12 injection was one to two weeks ago.  She has undergone various tests, including an EMG, which was normal, and a high sensitivity CRP test, which was elevated. Her trans growth factor beta one was also high. She has had a negative ANA test and has been tested for Lyme disease, which was not positive for Lyme.  Her family history  includes multiple relatives on her mother's side who died of cancer.  Her grandmother died of vocal cord cancer, and her mother's father had skin cancer.    MEDICAL HISTORY:  Past Medical History:  Diagnosis Date   Anemia    IRON INFUSION IN DEC 2018   Anxiety    Arthritis    Complication of anesthesia    AFTER HYSTERECTOMY, HAD TROUBLE BREATHING   Depression    Gastritis    GERD (gastroesophageal reflux disease)    RARE-NO MEDS   Hyperlipidemia    Hypothyroidism    Hypothyroidism    OSA (obstructive sleep apnea)    Seizures (HCC)    AS A CHILD X 5-6 TIMES   Sleep apnea     RECENTLY DX AND NO CPAP   Tachycardia    PTS PCP JUST TOOK PT OFF OF HER [facility] ON 05-11-17 THINKING THIS MAY BE CAUSING TACHYCARDIA    SURGICAL HISTORY: Past Surgical History:  Procedure Laterality Date   ABDOMINAL HYSTERECTOMY     CHOLECYSTECTOMY     COLONOSCOPY WITH ESOPHAGOGASTRODUODENOSCOPY (EGD)     COLONOSCOPY WITH PROPOFOL  N/A 07/01/2023   Procedure: COLONOSCOPY WITH PROPOFOL ;  Surgeon: Therisa Bi, MD;  Location: [facility] ENDOSCOPY;  Service: Gastroenterology;  Laterality: N/A;   ESOPHAGOGASTRODUODENOSCOPY N/A 11/26/2023   Procedure: EGD (ESOPHAGOGASTRODUODENOSCOPY);  Surgeon: Therisa Bi, MD;  Location: [facility] ENDOSCOPY;  Service: Gastroenterology;  Laterality: N/A;  DM   (Gastric Mapping)   ESOPHAGOGASTRODUODENOSCOPY (EGD) WITH PROPOFOL  N/A 07/01/2023   Procedure: ESOPHAGOGASTRODUODENOSCOPY (EGD) WITH PROPOFOL ;  Surgeon: Therisa Bi, MD;  Location: [facility] ENDOSCOPY;  Service: Gastroenterology;  Laterality: N/A;   LAPAROSCOPY N/A 05/21/2017   Procedure: Exploratory laporscropsy, lyses of adhesions, left cyst wall removal;  Surgeon: Ward, Mitzie BROCKS, MD;  Location: [facility] ORS;  Service: Gynecology;  Laterality: N/A;  Lysis of Adhesions   THYROIDECTOMY     AGE 46    SOCIAL HISTORY: Social History   Socioeconomic History   Marital status: Single    Spouse name: Not on file   Number of children: 1    Years of education: Not on file   Highest education level: GED or equivalent  Occupational History   Not on file  Tobacco Use   Smoking status: Every Day    Current packs/day: 1.00    Average packs/day: 1 pack/day for 25.0 years (25.0 ttl pk-yrs)    Types: Cigarettes    Passive exposure: Past   Smokeless tobacco: Never  Vaping Use   Vaping status: Former  Substance and Sexual Activity   Alcohol use: Not Currently   Drug use: No   Sexual activity: Not Currently  Other Topics Concern   Not on file  Social History Narrative   Not on file   Social Drivers of Health  Financial Resource Strain: Low Risk  (02/03/2024)   Overall Financial Resource Strain (CARDIA)    Difficulty of Paying Living Expenses: Not very hard  Food Insecurity: No Food Insecurity (02/17/2024)   Hunger Vital Sign    Worried About Running Out of Food in the Last Year: Never true    Ran Out of Food in the Last Year: Never true  Transportation Needs: No Transportation Needs (02/17/2024)   PRAPARE - Administrator, Civil Service (Medical): No    Lack of Transportation (Non-Medical): No  Physical Activity: Sufficiently Active (02/03/2024)   Exercise Vital Sign    Days of Exercise per Week: 7 days    Minutes of Exercise per Session: 30 min  Stress: Patient Declined (02/03/2024)   [employer] of Occupational Health - Occupational Stress Questionnaire    Feeling of Stress: Patient declined  Social Connections: Unknown (02/03/2024)   Social Connection and Isolation Panel    Frequency of Communication with Friends and Family: Once a week    Frequency of Social Gatherings with Friends and Family: Never    Attends Religious Services: Never    Database Administrator or Organizations: Not on file    Attends Banker Meetings: Not on file    Marital Status: Never married  Intimate Partner Violence: Not At Risk (10/28/2023)   Humiliation, Afraid, Rape, and Kick questionnaire    Fear of  Current or Ex-Partner: No    Emotionally Abused: No    Physically Abused: No    Sexually Abused: No    FAMILY HISTORY: Family History  Problem Relation Age of Onset   Hypertension Mother    Hyperlipidemia Mother    Thyroid disease Mother    Heart disease Father    ADD / ADHD Daughter    Hyperlipidemia Paternal Uncle    Heart disease Paternal Grandfather     ALLERGIES:  has no known allergies.  MEDICATIONS:  Current Outpatient Medications  Medication Sig Dispense Refill   cyanocobalamin  (VITAMIN B12) 1000 MCG/ML injection Inject into the muscle.     levothyroxine (SYNTHROID) 125 MCG tablet Take 1 tablet (125 mcg total) by mouth daily before breakfast. 90 tablet 1   omeprazole  (PRILOSEC) 40 MG capsule Take 1 capsule (40 mg total) by mouth daily. 90 capsule 1   vitamin D3 (CHOLECALCIFEROL) 25 MCG tablet Take 1,000 Units by mouth 2 (two) times a week.     cromolyn (GASTROCROM) 100 MG/5ML solution TAKE 10 ML FOUR TIMES DAILY PRIOR TO MEALS (Patient not taking: Reported on 02/18/2024)     No current facility-administered medications for this visit.    Review of Systems  Constitutional:  Positive for fatigue and unexpected weight change. Negative for chills and fever.  HENT:   Negative for hearing loss and voice change.   Eyes:  Negative for eye problems.  Respiratory:  Negative for chest tightness, cough and shortness of breath.   Cardiovascular:  Negative for chest pain.  Gastrointestinal:  Negative for abdominal distention, abdominal pain and blood in stool.  Endocrine: Negative for hot flashes.  Genitourinary:  Negative for difficulty urinating and frequency.   Musculoskeletal:  Negative for arthralgias.  Skin:  Negative for itching and rash.  Neurological:  Positive for dizziness and numbness. Negative for extremity weakness.  Hematological:  Negative for adenopathy.  Psychiatric/Behavioral:  Negative for confusion.    PHYSICAL EXAMINATION:  Vitals:   02/18/24 0931   BP: 126/62  Pulse: 70  Resp: 18  Temp: 98 F (  36.7 C)  SpO2: 100%   Filed Weights   02/18/24 0931  Weight: 227 lb 3.2 oz (103.1 kg)    Physical Exam Constitutional:      General: She is not in acute distress.    Appearance: She is obese.  HENT:     Head: Normocephalic and atraumatic.  Eyes:     General: No scleral icterus. Cardiovascular:     Rate and Rhythm: Normal rate and regular rhythm.  Pulmonary:     Effort: Pulmonary effort is normal. No respiratory distress.     Breath sounds: Normal breath sounds. No wheezing.  Abdominal:     General: Bowel sounds are normal. There is no distension.     Palpations: Abdomen is soft.  Musculoskeletal:        General: No deformity. Normal range of motion.     Cervical back: Normal range of motion and neck supple.  Lymphadenopathy:     Cervical: No cervical adenopathy.  Skin:    General: Skin is warm and dry.     Findings: No erythema or rash.  Neurological:     Mental Status: She is alert and oriented to person, place, and time. Mental status is at baseline.     LABORATORY DATA:  I have reviewed the data as listed    Latest Ref Rng & Units 04/18/2023    8:34 AM 11/12/2022    5:35 PM 05/31/2022    3:06 PM  CBC  WBC 4.0 - 10.5 K/uL 8.0  11.5  10.4   Hemoglobin 12.0 - 15.0 g/dL 85.4  85.8  85.6   Hematocrit 36.0 - 46.0 % 43.9  42.3  44.4   Platelets 150 - 400 K/uL 309  327  355       Latest Ref Rng & Units 10/28/2023    9:17 AM 04/18/2023    8:34 AM 11/12/2022    5:35 PM  CMP  Glucose 65 - 99 mg/dL 75  895  87   BUN 7 - 25 mg/dL 18  21  20    Creatinine 0.50 - 0.99 mg/dL 9.33  9.33  9.16   Sodium 135 - 146 mmol/L 139  136  137   Potassium 3.5 - 5.3 mmol/L 4.4  4.0  3.8   Chloride 98 - 110 mmol/L 105  102  102   CO2 20 - 32 mmol/L 28  23  22    Calcium 8.6 - 10.2 mg/dL 9.5  9.2  9.4   Total Protein 6.1 - 8.1 g/dL 6.5  7.4    Total Bilirubin 0.2 - 1.2 mg/dL 0.6  0.6    Alkaline Phos 38 - 126 U/L  67    AST 10 - 35 U/L 10   13    ALT 6 - 29 U/L 10  16        RADIOGRAPHIC STUDIES: I have personally reviewed the radiological images as listed and agreed with the findings in the report. DG HIP UNILAT W OR W/O PELVIS 2-3 VIEWS RIGHT Result Date: 02/05/2024 EXAM: 2 OR MORE VIEW(S) XRAY OF THE PELVIS AND RIGHT HIP 02/04/2024 08:52:00 AM COMPARISON: CT 04/22/2023. CLINICAL HISTORY: Right hip pain. Chronic right hip pain. Patient states pain is bilateral but started out worse on the right. Patient states has to walk with a limp now due to pain. FINDINGS: JOINTS: SI joints are symmetric. Bilateral acetabular osteophytes and sclerosis. Bilateral femoral trochanteric enthesophytes. No acute fracture. Bilateral hips demonstrate normal alignment. SOFT TISSUES: The soft tissues are  unremarkable. IMPRESSION: 1. No acute abnormality of the right hip. 2. Bilateral acetabular osteoarthritis. 3. Bilateral greater trochanteric enthesopathy. Electronically signed by: Dayne Hassell MD 02/05/2024 09:47 PM EDT RP Workstation: [id number]

## 2024-02-21 LAB — KAPPA/LAMBDA LIGHT CHAINS
Kappa free light chain: 17.6 mg/L (ref 3.3–19.4)
Kappa, lambda light chain ratio: 1.26 (ref 0.26–1.65)
Lambda free light chains: 14 mg/L (ref 5.7–26.3)

## 2024-02-24 ENCOUNTER — Ambulatory Visit: Payer: Self-pay | Admitting: *Deleted

## 2024-02-24 ENCOUNTER — Encounter: Payer: Self-pay | Admitting: Nurse Practitioner

## 2024-02-24 LAB — MULTIPLE MYELOMA PANEL, SERUM
Albumin SerPl Elph-Mcnc: 3.9 g/dL (ref 2.9–4.4)
Albumin/Glob SerPl: 1.3 (ref 0.7–1.7)
Alpha 1: 0.3 g/dL (ref 0.0–0.4)
Alpha2 Glob SerPl Elph-Mcnc: 0.7 g/dL (ref 0.4–1.0)
B-Globulin SerPl Elph-Mcnc: 1 g/dL (ref 0.7–1.3)
Gamma Glob SerPl Elph-Mcnc: 1.1 g/dL (ref 0.4–1.8)
Globulin, Total: 3.1 g/dL (ref 2.2–3.9)
IgA: 187 mg/dL (ref 87–352)
IgG (Immunoglobin G), Serum: 1181 mg/dL (ref 586–1602)
IgM (Immunoglobulin M), Srm: 81 mg/dL (ref 26–217)
Total Protein ELP: 7 g/dL (ref 6.0–8.5)

## 2024-02-24 NOTE — Telephone Encounter (Signed)
 FYI Only or Action Required?: FYI only for provider: patient declines appointment at this time.  Patient was last seen in primary care on 02/04/2024 by Gareth Mliss FALCON, FNP.  Called Nurse Triage reporting Dizziness (Dizziness, headache, nausea).  Symptoms began a week ago.  Interventions attempted: Nothing.  Symptoms are: unchanged.  Triage Disposition: See PCP When Office is Open (Within 3 Days)  Patient/caregiver understands and will follow disposition?: No, refuses disposition  Copied from CRM [id number]. Topic: Clinical - Red Word Triage >> Feb 24, 2024  9:36 AM Avram MATSU wrote: Red Word that prompted transfer to Nurse Triage: dizziness,headaches,neasea   ----------------------------------------------------------------------- From previous Reason for Contact - Scheduling: Patient/patient representative is calling to schedule an appointment. Refer to attachments for appointment information. Reason for Disposition  [1] MODERATE dizziness (e.g., interferes with normal activities) AND [2] has been evaluated by doctor (or NP/PA) for this  Answer Assessment - Initial Assessment Questions Patient declines appointment at alternative location- she feels she will not get proper diagnosis and wants to try to change her medication to different manufacturer which she has to do at [place] [facility]. She ia going to call them in morning for same day appointment. She will call PCP back if needed.    1. DESCRIPTION: Describe your dizziness.     More intense than normal 2. LIGHTHEADED: Do you feel lightheaded? (e.g., somewhat faint, woozy, weak upon standing)     After walk patient felt faint yesterday 3. VERTIGO: Do you feel like either you or the room is spinning or tilting? (i.e., vertigo)     no 4. SEVERITY: How bad is it?  Do you feel like you are going to faint? Can you stand and walk?     Gets worse- walking normal if slow- can not be hurried 5. ONSET:  When did the dizziness  begin?     Chronic but is worse 6. AGGRAVATING FACTORS: Does anything make it worse? (e.g., standing, change in head position)     Fast movement 7. HEART RATE: Can you tell me your heart rate? How many beats in 15 seconds?  (Note: Not all patients can do this.)       Systolic usually in 90 during episodes, HR- 70 baseline 8. CAUSE: What do you think is causing the dizziness? (e.g., decreased fluids or food, diarrhea, emotional distress, heat exposure, new medicine, sudden standing, vomiting; unknown)     Change in manufacturer of medication 9. RECURRENT SYMPTOM: Have you had dizziness before? If Yes, ask: When was the last time? What happened that time?     no 10. OTHER SYMPTOMS: Do you have any other symptoms? (e.g., fever, chest pain, vomiting, diarrhea, bleeding)       headache  Answer Assessment - Initial Assessment Questions 1. NAUSEA SEVERITY: How bad is the nausea? (e.g., mild, moderate, severe; dehydration, weight loss)     Mild to severe 2. ONSET: When did the nausea begin?     11/2 3. VOMITING: Any vomiting? If Yes, ask: How many times today?     No vomiting 4. RECURRENT SYMPTOM: Have you had nausea before? If Yes, ask: When was the last time? What happened that time?     New symptom 5. CAUSE: What do you think is causing the nausea?     Noticed nausea got worse with change in manufacturer of thyroid medication  Protocols used: Nausea-A-AH, Dizziness - Lightheadedness-A-AH

## 2024-02-25 ENCOUNTER — Other Ambulatory Visit: Payer: Self-pay

## 2024-02-25 DIAGNOSIS — G629 Polyneuropathy, unspecified: Secondary | ICD-10-CM

## 2024-02-25 DIAGNOSIS — R4189 Other symptoms and signs involving cognitive functions and awareness: Secondary | ICD-10-CM

## 2024-03-02 ENCOUNTER — Encounter: Payer: Self-pay | Admitting: Oncology

## 2024-03-02 ENCOUNTER — Encounter: Payer: Self-pay | Admitting: Nurse Practitioner

## 2024-03-02 LAB — IFE+PROTEIN ELECTRO, 24-HR UR
% BETA, Urine: 0 %
ALPHA 1 URINE: 0 %
Albumin, U: 100 %
Alpha 2, Urine: 0 %
GAMMA GLOBULIN URINE: 0 %
Total Protein, Urine-Ur/day: 87 mg/(24.h) (ref 30–150)
Total Protein, Urine: 5.1 mg/dL
Total Volume: 1700

## 2024-03-05 ENCOUNTER — Encounter: Payer: Self-pay | Admitting: Nurse Practitioner

## 2024-03-05 DIAGNOSIS — M25551 Pain in right hip: Secondary | ICD-10-CM

## 2024-03-05 DIAGNOSIS — M542 Cervicalgia: Secondary | ICD-10-CM

## 2024-03-15 NOTE — Progress Notes (Signed)
  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 Symptoms include tingling, muscle twitching, burning, and popping sensations. Differential diagnosis includes small fiber neuropathy, which can be associated with fibromyalgia. Skin biopsy performed to assess for decreased nerve fiber density indicative of small fiber neuropathy. The procedure involved taking a small piece of skin from the thigh and leg, with lidocaine  used for numbing. The procedure is minimally invasive and does not require stitches. - Performed skin biopsy from thigh and leg to assess for small fiber neuropathy - Sent biopsy sample to lab in [place]  for analysis - Await biopsy results in approximately one month  Chronic dizziness and lightheadedness Persisting for approximately two years, with onset coinciding with a reduction in blood pressure and change in body temperature regulation. Symptoms have improved slightly with dietary modifications, specifically reducing carbohydrate intake and losing weight. - Continue dietary modifications to manage dizziness  History of Present Illness [place]EE HARB is a 48 year old female who presents for a skin biopsy. She was referred by Dr. Lane for evaluation of her neurological symptoms.  Sensory and neuromuscular symptoms - Sudden onset of tingling, muscle twitching, burning sensations, and 'popping, wet feelings' - Symptoms began approximately one year ago - Distribution is widespread and non-length dependent  Dizziness and autonomic dysfunction - Persistent dizziness for approximately ten months, described as '24/7 dizziness' - No identifiable cause for dizziness - No recent viral infections - No history of COVID-19 infection - Received two doses of COVID-19 vaccine, did not receive boosters due to adverse  reactions - Reduction in blood pressure noted - Altered temperature regulation, feeling cold all the time when previously felt hot - Onset of blood pressure and temperature changes concurrent with dizziness  Dietary changes and weight loss - Reduced carbohydrate intake, which exacerbates dizziness - Approximately 100-pound weight loss - Improvement in symptoms described as 'very slow going'  Impact on daily activities - Significant limitation in ability to engage in previously enjoyed activities, such as riding four-wheelers  Results Procedure: Skin biopsy   Description: Small piece of skin excised from the leg and dorsum of the foot.  There were no vitals filed for this visit. There is no height or weight on file to calculate BMI.  Physical Exam    This note has been created using automated tools and reviewed for accuracy by [facility].

## 2024-03-17 ENCOUNTER — Ambulatory Visit

## 2024-03-17 ENCOUNTER — Ambulatory Visit (INDEPENDENT_AMBULATORY_CARE_PROVIDER_SITE_OTHER)

## 2024-03-17 DIAGNOSIS — M7632 Iliotibial band syndrome, left leg: Secondary | ICD-10-CM

## 2024-03-17 DIAGNOSIS — M542 Cervicalgia: Secondary | ICD-10-CM

## 2024-03-17 DIAGNOSIS — G8929 Other chronic pain: Secondary | ICD-10-CM

## 2024-03-17 DIAGNOSIS — M4722 Other spondylosis with radiculopathy, cervical region: Secondary | ICD-10-CM | POA: Diagnosis not present

## 2024-03-17 DIAGNOSIS — M7631 Iliotibial band syndrome, right leg: Secondary | ICD-10-CM | POA: Diagnosis not present

## 2024-03-17 NOTE — Progress Notes (Signed)
 Orthopaedic Surgery New Patient Visit   History of Present Illness: The patient is a 48 y.o. female seen in clinic for 4-year history of bilateral hip pain. Symptoms began on right side.  Located on lateral aspect of bilateral hips.  Occasional radiating pain to lateral aspect of knee.  Very rarely goes past the knee.  Improves with rest.  Is worse with walking for extended period of time, causes fatigue/tiredness in the legs.  Patient reports stiffness when moving from a sitting to standing position.  Pain exacerbated with going up or down steps and stairs.  Denies associated groin pain.  Has previously undergone bilateral hip x-rays.  Denies previous history of hip injection.  Denies precipitating injury/trauma.  Patient also with 3-year history of posterior neck pain.  States present at lower cervical and upper thoracic spine.  Symptoms improve without movement or activity.  Exacerbated with movement of the neck or lifting the arms, looking down, or bending over.  Patient reports over past two years has had associated hypotension, cold sensation, headache, nausea, dizziness, blurry vision, edema and diffuse paresthesias.  Patient has been evaluated by PCP, neurologist, cardiologist, oncology, rheumatologist, and functional medicine provider.  Patient has undergone cervical spine MRI, brain MRI, bilateral carotid ultrasound, EMG, and small fiber nerve biopsy, as well as extensive lab work.  Patient has been referred for course of physical therapy.  Patient only attended a few sessions due to cervical traction exacerbating symptoms.  Patient states she has not had a clear diagnosis.  Patient 1 year ago, after cervical MRI was performed, was referred to physiatry and offered a cervical epidural steroid injection.  Patient was nervous and declined at the time.  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  Patient works retail banker for [employer].  Has had this job for 5 to 6 years.  Patient  states due to the stress of recent symptoms has lost approximately 100 pounds in the past 2 to 3 years (has significantly decreased carbohydrate intake due to this exacerbating her symptoms).     Past Medical, Social and Family History: Past Medical History:  Diagnosis Date   Anemia    IRON INFUSION IN DEC 2018   Anxiety    Arthritis    Complication of anesthesia    AFTER HYSTERECTOMY, HAD TROUBLE BREATHING   Depression    Gastritis    GERD (gastroesophageal reflux disease)    RARE-NO MEDS   Hyperlipidemia    Hypothyroidism    Hypothyroidism    OSA (obstructive sleep apnea)    Seizures (HCC)    AS A CHILD X 5-6 TIMES   Sleep apnea     RECENTLY DX AND NO CPAP   Tachycardia    PTS PCP JUST TOOK PT OFF OF HER [facility] ON 05-11-17 THINKING THIS MAY BE CAUSING TACHYCARDIA   Past Surgical History:  Procedure Laterality Date   ABDOMINAL HYSTERECTOMY     CHOLECYSTECTOMY     COLONOSCOPY WITH ESOPHAGOGASTRODUODENOSCOPY (EGD)     COLONOSCOPY WITH PROPOFOL  N/A 07/01/2023   Procedure: COLONOSCOPY WITH PROPOFOL ;  Surgeon: Therisa Bi, MD;  Location: [facility] ENDOSCOPY;  Service: Gastroenterology;  Laterality: N/A;   ESOPHAGOGASTRODUODENOSCOPY N/A 11/26/2023   Procedure: EGD (ESOPHAGOGASTRODUODENOSCOPY);  Surgeon: Therisa Bi, MD;  Location: [facility] ENDOSCOPY;  Service: Gastroenterology;  Laterality: N/A;  DM   (Gastric Mapping)   ESOPHAGOGASTRODUODENOSCOPY (EGD) WITH PROPOFOL  N/A 07/01/2023   Procedure: ESOPHAGOGASTRODUODENOSCOPY (EGD) WITH PROPOFOL ;  Surgeon: Therisa Bi, MD;  Location: [facility] ENDOSCOPY;  Service: Gastroenterology;  Laterality: N/A;   LAPAROSCOPY N/A 05/21/2017   Procedure: Exploratory laporscropsy, lyses of adhesions, left cyst wall removal;  Surgeon: Ward, Mitzie BROCKS, MD;  Location: [facility] ORS;  Service: Gynecology;  Laterality: N/A;  Lysis of Adhesions   THYROIDECTOMY     AGE 63   No Known Allergies Current Outpatient Medications on File Prior to Visit  Medication Sig  Dispense Refill   cromolyn (GASTROCROM) 100 MG/5ML solution TAKE 10 ML FOUR TIMES DAILY PRIOR TO MEALS (Patient not taking: Reported on 02/18/2024)     cyanocobalamin  (VITAMIN B12) 1000 MCG/ML injection Inject into the muscle.     levothyroxine  (SYNTHROID ) 125 MCG tablet Take 1 tablet (125 mcg total) by mouth daily before breakfast. 90 tablet 1   omeprazole  (PRILOSEC) 40 MG capsule Take 1 capsule (40 mg total) by mouth daily. 90 capsule 1   vitamin D3 (CHOLECALCIFEROL) 25 MCG tablet Take 1,000 Units by mouth 2 (two) times a week.     No current facility-administered medications on file prior to visit.   Social History   Tobacco Use   Smoking status: Every Day    Current packs/day: 1.00    Average packs/day: 1 pack/day for 25.0 years (25.0 ttl pk-yrs)    Types: Cigarettes    Passive exposure: Past   Smokeless tobacco: Never  Vaping Use   Vaping status: Former  Substance Use Topics   Alcohol use: Not Currently   Drug use: No      I have reviewed past medical, surgical, social and family history, medications and allergies as documented in the EMR.  Review of Systems - A ROS was performed including pertinent positives and negatives as documented in the HPI.     Physical Exam:  General/Constitutional: NAD Vascular: No edema, swelling or tenderness, except as noted in detailed exam Integumentary: No impressive skin lesions present, except as noted in detailed exam Neuro/Psych: Normal mood and affect, oriented to person, place and time Musculoskeletal: Normal, except as noted in detailed exam and in HPI   Focused Orthopaedic Examination:   Neck focused exam: Palpation: mild tenderness with palpation along midline cervical spine. Mild tenderness with palpation over cervical paraspinal musculature ROM: within normal limits in flexion, extension, rotation and side-bending Spurling's positive bilaterally  -Strength exam      Left  Right Grip strength                 5/5  5/5 Interosseus   5/5   5/5 Wrist extension  5/5  5/5 Wrist flexion   5/5  5/5 Elbow flexion   5/5  5/5 Deltoid    5/5  5/5   -Sensory exam    Sensation intact to light touch in C5-T1 nerve distributions of bilateral upper extremities, subjective numbness in non-dermatomal distributions of bilateral upper extremities   -Vascular/Lymphatic: Bilateral upper extremities warm and well perfused     Hip Examination (focused): Patient sitting comfortably on examination table. Mild tenderness with palpation over bilateral greater trochanter and along IT band. Mild tenderness with palpation over Gerdy's tubercle bilaterally. No pain with range of motion assessment of bilateral hips. Smooth arc of motion in internal and external rotation with the hip flexed to 90 degrees.    RIGHT LEFT  Palpation (pain): Anterior negative negative   Iliopsoas negative negative   Greater troch positive positive   ASIS negative negative   AIIS negative negative   Posterior negative negative   Adductors negative negative  Special Tests: FADIR negative negative   FABER  negative negative   Log Roll negative negative  Other: Hip flexion strength  5/5 5/5   Hip adductor strength  5/5  5/5   Hip abductor strength  5/5 5/5   -Sensation intact to light touch in L3-S1 nerve distributions of bilateral lower extremities  -Straight leg raise: Negative bilaterally  -Vascular/Lymphatic: Bilateral lower extremities warm and well perfused    XR Cervical Spine Imaging: X-rays of the cervical spine including 4-views (AP, lateral, flexion, extension) obtained today 03/17/2024 at [facility] at [facility] were reviewed personally by me.  Per my independent interpretation these images show mild degenerative changes with endplate spurring, primarily on C4, C5, and C6. No significant spondylolisthesis.  Possible post-surgical changes or calcifications visualized adjacent to the anterior cervical  spine around C4-C6.  XR Pelvis/Hip Imaging: X-rays of the pelvis and right hip were obtained on 02/04/2024 at outpatient facility and were available for my review today.  Per my interpretation, there are no acute fractures or dislocations.  Diffuse enthesopathic changes noted about bilateral iliac crests, greater trochanters.  Mild pincer deformities noted about bilateral hips.   Radiology Read: Cervical Spine MRI without contrast 02/01/2023 IMPRESSION: 1. Multilevel cervical spondylosis with diffuse spinal stenosis at C3-4 through C7-T1, most pronounced at C5-6 where stenosis is mild-to-moderate in nature. 2. Multilevel foraminal narrowing due to disc bulging and uncovertebral disease as above. Notable findings include moderate to severe left C4 foraminal stenosis, severe right with mild-to-moderate left C5 foraminal narrowing, with mild left C6 foraminal stenosis.  Cervical Spine X-Ray FINDINGS: There is no evidence of cervical spine fracture or prevertebral soft tissue swelling. Surgical clips at the level of the thyroid bed. Alignment is normal. No evidence of static listhesis or instability with flexion or extension. Mild disc height loss at C4-C5 and C5-C6.   IMPRESSION: 1. No acute osseous abnormality of the cervical spine. No static listhesis or instability with flexion or extension. 2. Mild disc height loss at C4-C5 and C5-C6.   Pelvis/right hip x-ray IMPRESSION: 1. No acute abnormality of the right hip. 2. Bilateral acetabular osteoarthritis. 3. Bilateral greater trochanteric enthesopathy.  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 We reviewed patient's history, examination, and imaging in detail. Based on information available for this encounter, patient with 4-year history of bilateral hip pain.  Located on the lateral aspect and radiating symptoms down to  the knee.  No precipitating injury/trauma.  Mild tenderness to palpation over the greater trochanters bilaterally and moderate tenderness along the IT band.  Discussed IT band syndrome with patient.  Recommended conservative management of a formal course of physical therapy.  Also advised patient can use ice/heat, over-the-counter analgesics, over-the-counter anti-inflammatories.  Patient was also seen regarding neck pain.  Present for 3 years.  Patient over the past 2 years has had associated dizziness, blurry vision, nausea, and paresthesias.  Patient currently undergoing extensive workup.  Patient with a cervical spine MRI from 02/01/2023 that did reveal multilevel cervical spondylosis with diffuse spinal stenosis and severe left C4 foraminal stenosis and severe right C5 foraminal narrowing.  Patient had previously been referred for a cervical epidural steroid injection, but declined at that time.  Cervical spine x-ray performed in office today reveals mild degenerative changes but no instability with flexion/extension views.  Did discuss imaging findings with patient (both today's xray and previous MRI) and recommended follow-up with neurosurgery.  She may benefit from referral to  pain management for re-consideration of injections but she would prefer to obtain neurosurgery recommendations prior to any interventions. Referral placed for [facility] at [facility].   Patient education material was provided.  All questions, concerns and comments were addressed to the best of my ability.  Follow-up: 8 weeks for bilateral IT band, sooner with any new/symptoms or concerns   Arlyss GEANNIE Schneider, DO Orthopedic Surgery & Sports Medicine [place]   This document was dictated using Dragon voice recognition software. A reasonable attempt at proof reading has been made to minimize errors.

## 2024-03-17 NOTE — Patient Instructions (Signed)

## 2024-03-21 ENCOUNTER — Ambulatory Visit (INDEPENDENT_AMBULATORY_CARE_PROVIDER_SITE_OTHER)

## 2024-03-21 DIAGNOSIS — E538 Deficiency of other specified B group vitamins: Secondary | ICD-10-CM

## 2024-03-21 MED ORDER — CYANOCOBALAMIN 1000 MCG/ML IJ SOLN
1000.0000 ug | Freq: Once | INTRAMUSCULAR | Status: AC
  Administered 2024-03-21: 1000 ug via INTRAMUSCULAR

## 2024-04-26 ENCOUNTER — Ambulatory Visit

## 2024-04-26 DIAGNOSIS — E538 Deficiency of other specified B group vitamins: Secondary | ICD-10-CM

## 2024-04-26 MED ORDER — CYANOCOBALAMIN 1000 MCG/ML IJ SOLN
1000.0000 ug | Freq: Once | INTRAMUSCULAR | Status: AC
  Administered 2024-04-26: 1000 ug via INTRAMUSCULAR

## 2024-04-27 ENCOUNTER — Ambulatory Visit: Admitting: Orthopedic Surgery

## 2024-04-28 NOTE — Progress Notes (Unsigned)
 "  Referring Physician:  Gust Molly, DO [address] [place],  [place] [address]  Primary Physician:  Dana Mliss FALCON, FNP  History of Present Illness: 05/04/2024 Ms. Dana Shaffer has a history of aortic atheroscloerosis, hypothyroidism, neuropathy, mixed hyperlipidemia, anemia, gastritis, GERD, OSA, small fiber neuropathy.   Had skin biopsy on 03/13/24 that was positive for small fiber neuropathy- she is seeing neurology at [facility] Dana Mhoon MD).   She has chronic neck pain with no arm pain. She has intermittent pain between her shoulder blades. She has diffuse left sided body numbness that is intermittent. She has tingling all over her body- this gets worse if she looks down for a long time. She has chronic dizziness is worse with any lifting/bending- she has intermittent tinnitus in left ear as well. She has some diffuse weakness in her legs. Neck pain is worse moving her head and arms. Pain is better with rest.   No dexterity issues. Not falling a lot, but has above dizziness.   She has constant pain in left buttock that is worse with sitting. No leg pain. She has to sit on right side of her butt.   She is unable to take any medications due to chronic dizziness (even NSAIDs).   Tobacco use: smokes 1 PPD x 30 years.   Bowel/Bladder Dysfunction: 1 year history of having to push hard to void along with urinary urgency  Conservative measures:  Physical therapy:  called Dana Shaffer for notes-patient thinks she went around December 2025 for a month but it was making dizziness symptoms worse Multimodal medical therapy including regular antiinflammatories: Ibuprofen  Injections:  no epidural steroid injections  Past Surgery: no spine surgeries  Dana Shaffer has no symptoms of cervical myelopathy.  The symptoms are causing a significant impact on the patient's life.   Review of Systems:  A 10 point review of systems is negative, except for the pertinent  positives and negatives detailed in the HPI.  Past Medical History: Past Medical History:  Diagnosis Date   Anemia    IRON INFUSION IN DEC 2018   Anxiety    Arthritis    Complication of anesthesia    AFTER HYSTERECTOMY, HAD TROUBLE BREATHING   Depression    Gastritis    GERD (gastroesophageal reflux disease)    RARE-NO MEDS   Hyperlipidemia    Hypothyroidism    Hypothyroidism    OSA (obstructive sleep apnea)    Seizures (HCC)    AS A CHILD X 5-6 TIMES   Sleep apnea     RECENTLY DX AND NO CPAP   Tachycardia    PTS PCP JUST TOOK PT OFF OF HER [facility] ON 05-11-17 THINKING THIS MAY BE CAUSING TACHYCARDIA    Past Surgical History: Past Surgical History:  Procedure Laterality Date   ABDOMINAL HYSTERECTOMY  2018   CHOLECYSTECTOMY  1999   COLONOSCOPY WITH ESOPHAGOGASTRODUODENOSCOPY (EGD)     COLONOSCOPY WITH PROPOFOL  N/A 07/01/2023   Procedure: COLONOSCOPY WITH PROPOFOL ;  Surgeon: Dana Bi, MD;  Location: [facility] ENDOSCOPY;  Service: Gastroenterology;  Laterality: N/A;   ESOPHAGOGASTRODUODENOSCOPY N/A 11/26/2023   Procedure: EGD (ESOPHAGOGASTRODUODENOSCOPY);  Surgeon: Dana Bi, MD;  Location: [facility] ENDOSCOPY;  Service: Gastroenterology;  Laterality: N/A;  DM   (Gastric Mapping)   ESOPHAGOGASTRODUODENOSCOPY (EGD) WITH PROPOFOL  N/A 07/01/2023   Procedure: ESOPHAGOGASTRODUODENOSCOPY (EGD) WITH PROPOFOL ;  Surgeon: Dana Bi, MD;  Location: [facility] ENDOSCOPY;  Service: Gastroenterology;  Laterality: N/A;   LAPAROSCOPY N/A 05/21/2017   Procedure: Exploratory  laporscropsy, lyses of adhesions, left cyst wall removal;  Surgeon: Dana Shaffer, Dana BROCKS, MD;  Location: [facility] ORS;  Service: Gynecology;  Laterality: N/A;  Lysis of Adhesions   THYROIDECTOMY     AGE 49    Allergies: Allergies as of 05/04/2024   (No Known Allergies)    Medications: Outpatient Encounter Medications as of 05/04/2024  Medication Sig   cyanocobalamin  (VITAMIN B12) 1000 MCG/ML injection Inject into the muscle.    levothyroxine  (SYNTHROID ) 125 MCG tablet Take 1 tablet (125 mcg total) by mouth daily before breakfast.   omeprazole  (PRILOSEC) 40 MG capsule Take 1 capsule (40 mg total) by mouth daily.   vitamin D3 (CHOLECALCIFEROL) 25 MCG tablet Take 1,000 Units by mouth 2 (two) times a week.   [DISCONTINUED] cromolyn (GASTROCROM) 100 MG/5ML solution TAKE 10 ML FOUR TIMES DAILY PRIOR TO MEALS (Patient not taking: Reported on 02/18/2024)   No facility-administered encounter medications on file as of 05/04/2024.    Social History: Social History[1]  Family Medical History: Family History  Problem Relation Age of Onset   Hypertension Mother    Hyperlipidemia Mother    Thyroid disease Mother    Heart disease Father    ADD / ADHD Daughter    Hyperlipidemia Paternal Uncle    Heart disease Paternal Grandfather    Cancer Maternal Grandmother     Physical Examination: Vitals:   05/04/24 1511  BP: 110/80    General: Patient is well developed, well nourished, calm, collected, and in no apparent distress. Attention to examination is appropriate.  Respiratory: Patient is breathing without any difficulty.   NEUROLOGICAL:     Awake, alert, oriented to person, place, and time.  Speech is clear and fluent. Fund of knowledge is appropriate.   Cranial Nerves: Pupils equal round and reactive to light.  Facial tone is symmetric.    No posterior cervical tenderness. No tenderness in bilateral trapezial region.   Mild tenderness medial scapular region.   No lower lumbar tenderness. No tenderness in left buttock.   No abnormal lesions on exposed skin.   Strength: Side Biceps Triceps Deltoid Interossei Grip Wrist Ext. Wrist Flex.  R 5 5 5 5 5 5 5   L 5 5 5 5 5 5 5    Side Iliopsoas Quads Hamstring PF DF EHL  R 5 5 5 5 5 5   L 5 5 5 5 5 5    Reflexes are 1+ and symmetric at the biceps, brachioradialis, patella and achilles.   Hoffman's is absent.  Clonus is not present.   Bilateral upper and lower  extremity sensation is intact to light touch, but diminished in entire left side.   No pain with IR/ER of both hips. Mild tenderness over greater trochanteric bursa bilaterally.   Gait is normal.     Medical Decision Making  Imaging: Cervical xrays dated 03/17/24:  FINDINGS: There is no evidence of cervical spine fracture or prevertebral soft tissue swelling. Surgical clips at the level of the thyroid bed. Alignment is normal. No evidence of static listhesis or instability with flexion or extension. Mild disc height loss at C4-C5 and C5-C6.   IMPRESSION: 1. No acute osseous abnormality of the cervical spine. No static listhesis or instability with flexion or extension. 2. Mild disc height loss at C4-C5 and C5-C6.     Electronically Signed   By: Harrietta Sherry M.D.   On: 03/17/2024 16:19  I have personally reviewed the images and agree with the above interpretation.   EMG of bilateral upper  extremities dated 08/04/23:  Impression: Abnormal study. There is electrodiagnostic evidence of a chronic, moderate left and mild right carpal tunnel syndrome.   Thank you for the referral of this patient. It was our privilege to participate in care of your patient. Feel free to contact us  with any further questions.  _____________________________ Jannett Fairly, M.D.    EMG of lower extremities on 08/11/23 was normal.    Assessment and Plan: Ms. Perno has known small fiber neuropathy.   She has chronic neck pain with no arm pain. She has intermittent pain between her shoulder blades.   She has diffuse left sided body numbness that is intermittent. She has tingling all over her body- this gets worse if she looks down for a long time. She has chronic dizziness is worse with any lifting/bending and with moving her neck- she has intermittent tinnitus in left ear as well.   She has known cervical spondylosis and DDD C4-C6. Previous EMG of upper extremities on 08/04/23 showed chronic,  moderate left and mild right carpal tunnel syndrome.  Treatment options discussed with patient and following plan made:   - MRI of cervical spine to evaluate chronic neck pain.  - MRI of thoracic spine to evaluate mid thoracic/periscapular pain.  - Depending on above results, may consider CTA of head and neck (would discuss with Dr. Claudene prior) to further evaluate dizziness?  - Previous PT in December made neck pain worse.  - She has not wanted to do injections in the past- she hates needles.  - Per patient, she does not tolerate medications- they all make her dizziness worse.  - Will schedule phone visit to review MRI results once I get them back.   Of note, having a few days of left buttock pain, no leg pain. No tenderness on exam. No weakness in legs. If pain does not improve, may consider further imaging.   I spent a total of 45 minutes in face-to-face and non-face-to-face activities related to this patient's care today including review of outside records, review of imaging, review of symptoms, physical exam, discussion of differential diagnosis, discussion of treatment options, and documentation.   Thank you for involving me in the care of this patient.   Glade Boys PA-C Dept. of Neurosurgery      [1]  Social History Tobacco Use   Smoking status: Every Day    Current packs/day: 1.00    Average packs/day: 1 pack/day for 35.1 years (35.1 ttl pk-yrs)    Types: Cigarettes    Start date: 62    Passive exposure: Past   Smokeless tobacco: Never  Vaping Use   Vaping status: Former  Substance Use Topics   Alcohol use: Not Currently   Drug use: No   "

## 2024-05-04 ENCOUNTER — Ambulatory Visit: Admitting: Orthopedic Surgery

## 2024-05-04 ENCOUNTER — Encounter: Payer: Self-pay | Admitting: Orthopedic Surgery

## 2024-05-04 VITALS — BP 110/80 | Ht 64.0 in | Wt 222.0 lb

## 2024-05-04 DIAGNOSIS — M542 Cervicalgia: Secondary | ICD-10-CM

## 2024-05-04 DIAGNOSIS — M50322 Other cervical disc degeneration at C5-C6 level: Secondary | ICD-10-CM

## 2024-05-04 DIAGNOSIS — M47812 Spondylosis without myelopathy or radiculopathy, cervical region: Secondary | ICD-10-CM | POA: Diagnosis not present

## 2024-05-04 DIAGNOSIS — M546 Pain in thoracic spine: Secondary | ICD-10-CM | POA: Diagnosis not present

## 2024-05-04 DIAGNOSIS — M50321 Other cervical disc degeneration at C4-C5 level: Secondary | ICD-10-CM | POA: Diagnosis not present

## 2024-05-04 NOTE — Patient Instructions (Signed)
 It was so nice to see you today. Thank you so much for coming in.    You have some wear and tear in your neck. This may be causing your neck pain.   I want to get an MRI of your neck and mid back to look into things further. We will get this approved through your insurance and [facility] will call you to schedule the appointment. Ask about your patient responsibility. You do not need to pay this prior to getting MRI, they can bill you.   [facility] is located at [address] in [place]. This is near the intersection of [address] and [address].   After you have the MRI, it can take 14-28 days for me to get the results back. If I don't have them in 2 weeks, we will call to try to get the results.   Once I have the results, we will call you to schedule a follow up phone visit with me to review them.   Please do not hesitate to call if you have any questions or concerns. You can also message me in MyChart.   Glade Boys PA-C [phone]     The physicians and staff at [facility] at [facility] are committed to providing excellent care. You may receive a survey asking for feedback about your experience at our office. We value you your feedback and appreciate you taking the time to to fill it out. The [facility] leadership team is also available to discuss your experience in person, feel free to contact us  [phone].

## 2024-05-12 ENCOUNTER — Ambulatory Visit

## 2024-05-12 ENCOUNTER — Ambulatory Visit
Admission: RE | Admit: 2024-05-12 | Discharge: 2024-05-12 | Disposition: A | Source: Ambulatory Visit | Attending: Orthopedic Surgery | Admitting: Orthopedic Surgery

## 2024-05-12 DIAGNOSIS — M546 Pain in thoracic spine: Secondary | ICD-10-CM

## 2024-05-12 DIAGNOSIS — M47812 Spondylosis without myelopathy or radiculopathy, cervical region: Secondary | ICD-10-CM

## 2024-05-12 DIAGNOSIS — M542 Cervicalgia: Secondary | ICD-10-CM

## 2024-05-15 ENCOUNTER — Encounter: Payer: Self-pay | Admitting: Orthopedic Surgery

## 2024-05-18 NOTE — Progress Notes (Unsigned)
 "  Telephone Visit- Progress Note: Referring Physician:  Gareth Mliss FALCON, FNP [address] [place],  [place] [address]  Primary Physician:  Gareth Mliss FALCON, FNP  This visit was performed via telephone.  Patient location: home Provider location: office  I spent a total of 15 minutes non-face-to-face activities for this visit on the date of this encounter including review of current clinical condition and response to treatment.    Patient has given verbal consent to this telephone visits and we reviewed the limitations of a telephone visit. Patient wishes to proceed.    Chief Complaint:  review imaging  History of Present Illness: AUNDRAYA DRIPPS is a 49 y.o. female has a history of aortic atheroscloerosis, hypothyroidism, neuropathy, mixed hyperlipidemia, anemia, gastritis, GERD, OSA, small fiber neuropathy.    Had skin biopsy on 03/13/24 that was positive for small fiber neuropathy- she is seeing neurology at [facility] Josefine Mhoon MD).   Last seen by me on 05/04/24 for chronic neck and no arm pain. She has intermittent pain between her shoulder blades.   She has known cervical spondylosis and DDD C4-C6. Previous EMG of upper extremities on 08/04/23 showed chronic, moderate left and mild right carpal tunnel syndrome.   Phone visit scheduled to review her cervical and thoracic MRI scans. Previous PT made her worse and she declined injections (hates needles).   She continues with chronic neck pain with no arm pain. She has intermittent pain between her shoulder blades. Neck pain is worse moving her head and arms. Pain is better with rest.   She has diffuse left sided body numbness that is intermittent. She has tingling all over her body- this gets worse if she looks down for a long time.   She has chronic dizziness is worse with any lifting/bending- also worse when she looks down.    Exam: No exam done as this was a telephone encounter.     Imaging: Cervical MRI  dated 05/12/24:  FINDINGS: Alignment: Vertebral bodies normally aligned with preservation of the normal cervical lordosis. No listhesis.   Vertebrae: Vertebral body height maintained without acute or chronic fracture. Bone marrow signal intensity within normal limits. No discrete or worrisome osseous lesions. No abnormal marrow edema.   Cord: Normal signal and morphology.   Posterior Fossa, vertebral arteries, paraspinal tissues: Unremarkable.   Disc levels:   C2-C3: Mild right-sided uncovertebral spurring without significant disc bulge. No spinal stenosis. Foramina remain patent.   C3-C4: Lobulated posterior disc osteophyte complex flattens and partially faces the ventral thecal sac, asymmetric to the left. Mild spinal stenosis. Left-sided uncovertebral spurring with moderate left C4 foraminal narrowing. Right neural foramina remains patent. Appearance is similar.   C4-C5: Small central disc protrusion indents the ventral thecal sac (series 110, image 15). Mild spinal stenosis. Bilateral uncovertebral spurring with moderate bilateral C5 foraminal stenosis. Changes are progressed.   C5-C6: Central to right paracentral disc protrusion indents the ventral thecal sac, contacting and mildly flattening the ventral cord (series 110, image 19). No cord signal changes. Mild spinal stenosis. Foramina remain patent. Appearance is mildly progressed.   C6-C7: Diffuse disc bulge with bilateral uncovertebral spurring. Flattening and partial effacement of the ventral thecal sac with resultant mild spinal stenosis. Foramina remain patent. Appearance is mildly progressed.   C7-T1: Diffuse disc bulge, asymmetric to the right. No spinal stenosis. Superimposed uncovertebral spurring without significant foraminal stenosis.   IMPRESSION: 1. Multilevel cervical spondylosis with resultant mild diffuse spinal stenosis at C3-4 through C6-7. 2. Multifactorial  degenerative changes with resultant  multilevel foraminal narrowing as above. Notable findings include moderate left C4 and bilateral C5 foraminal stenosis. 3. Overall, these changes are mildly progressed as compared to previous MRI from 02/01/2023.     Electronically Signed   By: Morene Hoard M.D.   On: 05/15/2024 04:34     Thoracic MRI dated 05/12/24:  FINDINGS: Alignment: Exaggeration of the normal thoracic kyphosis. No listhesis.   Vertebrae: Vertebral body height maintained without acute or chronic fracture. Bone marrow signal intensity overall within normal limits. No worrisome osseous lesions. Mild degenerative reactive endplate change noted at T8-9 through T10-11. No other abnormal marrow edema.   Cord:  Normal signal and morphology.   Paraspinal and other soft tissues: Unremarkable.   Disc levels:   T1-2: Negative interspace. Mild right-sided facet spurring. No stenosis.   T2-3: Negative interspace. Mild right-sided facet hypertrophy. No spinal stenosis. Mild right foraminal narrowing. Left neural foramina remains patent.   T3-4: Small right paracentral disc protrusion indents the right ventral thecal sac (series 101, image 14). Mild right-sided facet hypertrophy. No spinal stenosis. Mild right foraminal narrowing. Left neural foramina remains patent.   T4-5: Tiny right paracentral disc protrusion minimally indents the ventral thecal sac (series 101, image 18). Mild right-sided facet degeneration. No canal or foraminal stenosis.   T5-6: Small right paracentral disc protrusion with endplate spurring. Mild left-sided facet spurring. No spinal stenosis. Foramina remain patent.   T6-7: Mild endplate spurring without significant disc bulge. Mild left-sided facet spurring. No canal or foraminal stenosis.   T7-8: Disc desiccation with endplate spurring but no significant disc bulge. Mild left-sided facet spurring. No stenosis.   T8-9: Disc desiccation with endplate spurring but no  significant disc bulge. Mild left-sided facet spurring. No stenosis.   T9-10: Minimal disc bulge with endplate spurring. Mild left-sided facet spurring. No stenosis.   T10-11: Disc desiccation with mild disc bulge and endplate spurring. Superimposed tiny right paracentral disc protrusion minimally indents the right ventral thecal sac. Mild bilateral facet hypertrophy. No canal or foraminal stenosis.   T11-12: Chronic endplate Schmorl's node deformity without disc bulge. Mild left facet hypertrophy. No stenosis.   T12-L1: Mild diffuse disc bulge with endplate spurring. Superimposed small central disc protrusion indents the ventral thecal sac (series 100, image 58). No spinal stenosis. Foramina remain patent.   IMPRESSION: 1. Mild multilevel thoracic spondylosis with small disc protrusions at T3-4, T4-5, T5-6, T10-11, and T12-L1. No significant stenosis or overt neural impingement. 2. Mild multilevel facet hypertrophy throughout the thoracic spine as detailed above. Associated mild right foraminal narrowing at T2-3 and T3-4. No other significant foraminal encroachment within the thoracic spine.     Electronically Signed   By: Morene Hoard M.D.   On: 05/15/2024 04:45    I have personally reviewed the images and agree with the above interpretation.  Assessment and Plan: Ms. Demo has known small fiber neuropathy.    She has chronic neck pain with no arm pain. She has intermittent pain between her shoulder blades.    She has diffuse left sided body numbness that is intermittent. She has tingling all over her body- this gets worse if she looks down for a long time. She has chronic dizziness is worse with any lifting/bending and with moving her neck- she has intermittent tinnitus in left ear as well.    She has known cervical spondylosis and DDD C4-C6. She has mild central stenosis C3-C7 with moderate left foraminal stenosis C3-C4 and moderate bilateral foraminal  stenosis C4-C5.   Previous EMG of upper extremities on 08/04/23 showed chronic, moderate left and mild right carpal tunnel syndrome.   Above imaging reviewed with Dr. Claudene. Pain/numbness/tingling in her scapular region and shoulder may be from foraminal stenosis.    Treatment options discussed with patient and following plan made:   - Discussed trial of cervical foraminal injections to see if symptoms are cervical related.  - Per patient, she does not tolerate medications- they all make her dizziness worse. She is concerned that injection may make dizziness worse as well.   - She is more concerned about the cause of her dizziness and asks if there could be a vascular cause. She is also concerned about possible vagus nerve compression.  - She has repeat brain MRI next week and states she will not be seeing Dr. Noland unless something changes. She will r/s her f/u with Dr. Lane.  - Will review above with Dr. Claudene and message her with his recommendations.   ADDENDUM 05/19/24:  Reviewed with Dr. Claudene regarding her dizziness. He recommends that she follow up with Dr. Lane regarding any further testing and/or workup.   Message sent to patient.   Glade Boys PA-C Neurosurgery "

## 2024-05-19 ENCOUNTER — Encounter: Payer: Self-pay | Admitting: Orthopedic Surgery

## 2024-05-19 ENCOUNTER — Ambulatory Visit: Admitting: Orthopedic Surgery

## 2024-05-19 ENCOUNTER — Ambulatory Visit: Admission: RE | Admit: 2024-05-19 | Source: Ambulatory Visit

## 2024-05-19 DIAGNOSIS — K7689 Other specified diseases of liver: Secondary | ICD-10-CM

## 2024-05-19 DIAGNOSIS — M4802 Spinal stenosis, cervical region: Secondary | ICD-10-CM

## 2024-05-19 DIAGNOSIS — M546 Pain in thoracic spine: Secondary | ICD-10-CM

## 2024-05-19 DIAGNOSIS — K769 Liver disease, unspecified: Secondary | ICD-10-CM

## 2024-05-19 DIAGNOSIS — M47812 Spondylosis without myelopathy or radiculopathy, cervical region: Secondary | ICD-10-CM

## 2024-05-19 MED ORDER — GADOXETATE DISODIUM 0.25 MMOL/ML IV SOLN
10.0000 mL | Freq: Once | INTRAVENOUS | Status: AC | PRN
  Administered 2024-05-19: 10 mL via INTRAVENOUS

## 2024-06-16 ENCOUNTER — Ambulatory Visit
